# Patient Record
Sex: Male | Born: 1985
Health system: Southern US, Community
[De-identification: ages and names within clinical notes are randomized; demographics above are authoritative.]

## PROBLEM LIST (undated history)

## (undated) DIAGNOSIS — K219 Gastro-esophageal reflux disease without esophagitis: Secondary | ICD-10-CM

## (undated) HISTORY — PX: OTHER SURGICAL HISTORY: SHX169

## (undated) HISTORY — PX: AMPUTATION: SHX166

## (undated) HISTORY — PX: FRACTURE SURGERY: SHX138

## (undated) HISTORY — PX: HAND SURGERY: SHX662

## (undated) HISTORY — PX: TONSILLECTOMY: SUR1361

---

## 2014-11-01 HISTORY — PX: SHOULDER ARTHROSCOPY: SHX128

## 2018-04-13 ENCOUNTER — Other Ambulatory Visit: Payer: Self-pay | Admitting: Family Medicine

## 2018-04-13 DIAGNOSIS — M25511 Pain in right shoulder: Principal | ICD-10-CM

## 2018-04-13 DIAGNOSIS — G8929 Other chronic pain: Secondary | ICD-10-CM

## 2018-04-27 ENCOUNTER — Ambulatory Visit
Admission: RE | Admit: 2018-04-27 | Discharge: 2018-04-27 | Disposition: A | Discharge status: Home / Self Care | Payer: Commercial Managed Care - PPO | Source: Ambulatory Visit | Attending: Family Medicine | Admitting: Family Medicine

## 2018-04-27 ENCOUNTER — Other Ambulatory Visit: Payer: Self-pay | Admitting: Family Medicine

## 2018-04-27 DIAGNOSIS — S43491A Other sprain of right shoulder joint, initial encounter: Secondary | ICD-10-CM | POA: Insufficient documentation

## 2018-04-27 DIAGNOSIS — G8929 Other chronic pain: Secondary | ICD-10-CM | POA: Diagnosis present

## 2018-04-27 DIAGNOSIS — M25511 Pain in right shoulder: Secondary | ICD-10-CM | POA: Diagnosis present

## 2018-04-27 DIAGNOSIS — X58XXXA Exposure to other specified factors, initial encounter: Secondary | ICD-10-CM | POA: Diagnosis not present

## 2018-04-27 MED ORDER — IOPAMIDOL (ISOVUE-200) INJECTION 41%
1.0000 mL | Freq: Once | INTRAVENOUS | Status: AC | PRN
  Administered 2018-04-27: 1 mL via INTRAVENOUS
  Filled 2018-04-27: qty 50

## 2018-04-27 MED ORDER — GADOBENATE DIMEGLUMINE 529 MG/ML IV SOLN
0.1000 mL | Freq: Once | INTRAVENOUS | Status: AC | PRN
  Administered 2018-04-27: 0.1 mL via INTRAVENOUS

## 2018-04-27 MED ORDER — SODIUM CHLORIDE 0.9 % IJ SOLN
20.0000 mL | Freq: Once | INTRAMUSCULAR | Status: AC
  Administered 2018-04-27: 20 mL

## 2018-04-27 MED ORDER — LIDOCAINE HCL (PF) 1 % IJ SOLN
5.0000 mL | Freq: Once | INTRAMUSCULAR | Status: AC
  Administered 2018-04-27: 5 mL

## 2018-05-11 ENCOUNTER — Encounter
Admission: RE | Admit: 2018-05-11 | Discharge: 2018-05-11 | Disposition: A | Discharge status: Home / Self Care | Payer: Commercial Managed Care - PPO | Source: Ambulatory Visit | Attending: Orthopedic Surgery | Admitting: Orthopedic Surgery

## 2018-05-11 ENCOUNTER — Other Ambulatory Visit: Payer: Self-pay

## 2018-05-11 HISTORY — DX: Gastro-esophageal reflux disease without esophagitis: K21.9

## 2018-05-11 NOTE — Patient Instructions (Signed)
Your procedure is scheduled on: 05-15-18 Report to Same Day Surgery 2nd floor medical mall [facility](Medical Mall Entrance-take elevator on left to 2nd floor.  Check in with surgery information desk.) To find out your arrival time please call [phone] between 1PM - 3PM on 05-12-18  Remember: Instructions that are not followed completely may result in serious medical risk, up to and including death, or upon the discretion of your surgeon and anesthesiologist your surgery may need to be rescheduled.    _x___ 1. Do not eat food after midnight the night before your procedure. NO GUM OR CANDY AFTER MIDNIGHT. You may drink clear liquids up to 2 hours before you are scheduled to arrive at the hospital for your procedure.  Do not drink clear liquids within 2 hours of your scheduled arrival to the hospital.  Clear liquids include  --Water or Apple juice without pulp  --Clear carbohydrate beverage such as ClearFast or Gatorade  --Black Coffee or Clear Tea (No milk, no creamers, do not add anything to the coffee or Tea     __x__ 2. No Alcohol for 24 hours before or after surgery.   __x__3. No Smoking or e-cigarettes for 24 prior to surgery.  Do not use any chewable tobacco products for at least 6 hour prior to surgery   ____  4. Bring all medications with you on the day of surgery if instructed.    __x__ 5. Notify your doctor if there is any change in your medical condition     (cold, fever, infections).    x___6. On the morning of surgery brush your teeth with toothpaste and water.  You may rinse your mouth with mouth wash if you wish.  Do not swallow any toothpaste or mouthwash.   Do not wear jewelry, make-up, hairpins, clips or nail polish.  Do not wear lotions, powders, or perfumes. You may wear deodorant.  Do not shave 48 hours prior to surgery. Men may shave face and neck.  Do not bring valuables to the hospital.    [facility] any belongings or valuables.    Contacts, dentures or bridgework may not be worn into surgery.  Leave your suitcase in the car. After surgery it may be brought to your room.  For patients admitted to the hospital, discharge time is determined by your treatment team.  _  Patients discharged the day of surgery will not be allowed to drive home.  You will need someone to drive you home and stay with you the night of your procedure.    Please read over the following fact sheets that you were given:   [facility] Preparing for [facility]   ____ Take anti-hypertensive listed below, cardiac, seizure, asthma, anti-reflux and psychiatric medicines. These include:  1. NONE  2.  3.  4.  5.  6.  ____Fleets enema or Magnesium Citrate as directed.   ____ Use CHG Soap or sage wipes as directed on instruction sheet   ____ Use inhalers on the day of surgery and bring to hospital day of surgery  ____ Stop Metformin and Janumet 2 days prior to surgery.    ____ Take 1/2 of usual insulin dose the night before surgery and none on the morning surgery.   ____ Follow recommendations from Cardiologist, Pulmonologist or PCP regarding stopping Aspirin, Coumadin, Plavix ,Eliquis, Effient, or Pradaxa, and Pletal.  X____Stop Anti-inflammatories such as Advil, Aleve, Ibuprofen, Motrin, Naproxen, Naprosyn, Goodies powders or aspirin products NOW-OK to take Tylenol  ____ Stop supplements until after surgery.     ____ Bring C-Pap to the hospital.

## 2018-05-15 ENCOUNTER — Encounter
Admission: RE | Disposition: A | Discharge status: Home / Self Care | Payer: Self-pay | Source: Ambulatory Visit | Attending: Orthopedic Surgery

## 2018-05-15 ENCOUNTER — Ambulatory Visit
Admission: RE | Admit: 2018-05-15 | Discharge: 2018-05-15 | Disposition: A | Discharge status: Home / Self Care | Payer: Commercial Managed Care - PPO | Source: Ambulatory Visit | Attending: Orthopedic Surgery | Admitting: Orthopedic Surgery

## 2018-05-15 ENCOUNTER — Ambulatory Visit: Payer: Commercial Managed Care - PPO | Admitting: Certified Registered Nurse Anesthetist

## 2018-05-15 ENCOUNTER — Other Ambulatory Visit: Payer: Self-pay

## 2018-05-15 ENCOUNTER — Encounter: Payer: Self-pay | Admitting: *Deleted

## 2018-05-15 DIAGNOSIS — W000XXA Fall on same level due to ice and snow, initial encounter: Secondary | ICD-10-CM | POA: Insufficient documentation

## 2018-05-15 DIAGNOSIS — Z79899 Other long term (current) drug therapy: Secondary | ICD-10-CM | POA: Insufficient documentation

## 2018-05-15 DIAGNOSIS — Z89421 Acquired absence of other right toe(s): Secondary | ICD-10-CM | POA: Insufficient documentation

## 2018-05-15 DIAGNOSIS — S43491A Other sprain of right shoulder joint, initial encounter: Secondary | ICD-10-CM | POA: Diagnosis not present

## 2018-05-15 HISTORY — PX: BICEPT TENODESIS: SHX5116

## 2018-05-15 HISTORY — PX: SHOULDER ARTHROSCOPY WITH LABRAL REPAIR: SHX5691

## 2018-05-15 SURGERY — ARTHROSCOPY, SHOULDER, WITH GLENOID LABRUM REPAIR
Anesthesia: General | Laterality: Right

## 2018-05-15 MED ORDER — EPINEPHRINE 30 MG/30ML IJ SOLN
INTRAMUSCULAR | Status: AC
  Filled 2018-05-15: qty 1

## 2018-05-15 MED ORDER — CEFAZOLIN SODIUM-DEXTROSE 2-4 GM/100ML-% IV SOLN
2.0000 g | Freq: Once | INTRAVENOUS | Status: AC
  Administered 2018-05-15: 2 g via INTRAVENOUS

## 2018-05-15 MED ORDER — PHENYLEPHRINE HCL 10 MG/ML IJ SOLN
INTRAMUSCULAR | Status: AC
  Filled 2018-05-15: qty 1

## 2018-05-15 MED ORDER — FENTANYL CITRATE (PF) 250 MCG/5ML IJ SOLN
INTRAMUSCULAR | Status: AC
  Filled 2018-05-15: qty 5

## 2018-05-15 MED ORDER — MIDAZOLAM HCL 2 MG/2ML IJ SOLN
INTRAMUSCULAR | Status: AC
  Administered 2018-05-15: 1 mg via INTRAVENOUS
  Filled 2018-05-15: qty 2

## 2018-05-15 MED ORDER — LACTATED RINGERS IV SOLN
INTRAVENOUS | Status: DC
  Administered 2018-05-15 (×2): via INTRAVENOUS

## 2018-05-15 MED ORDER — LIDOCAINE HCL (PF) 1 % IJ SOLN
INTRAMUSCULAR | Status: AC
  Filled 2018-05-15: qty 5

## 2018-05-15 MED ORDER — PROPOFOL 10 MG/ML IV BOLUS
INTRAVENOUS | Status: AC
  Filled 2018-05-15: qty 20

## 2018-05-15 MED ORDER — BUPIVACAINE LIPOSOME 1.3 % IJ SUSP
INTRAMUSCULAR | Status: AC
  Filled 2018-05-15: qty 20

## 2018-05-15 MED ORDER — PROPOFOL 10 MG/ML IV BOLUS
INTRAVENOUS | Status: DC | PRN
  Administered 2018-05-15: 200 mg via INTRAVENOUS
  Administered 2018-05-15: 50 mg via INTRAVENOUS

## 2018-05-15 MED ORDER — FENTANYL CITRATE (PF) 100 MCG/2ML IJ SOLN
INTRAMUSCULAR | Status: DC | PRN
  Administered 2018-05-15: 250 ug via INTRAVENOUS
  Administered 2018-05-15: 100 ug via INTRAVENOUS

## 2018-05-15 MED ORDER — LIDOCAINE HCL (CARDIAC) PF 100 MG/5ML IV SOSY
PREFILLED_SYRINGE | INTRAVENOUS | Status: DC | PRN
  Administered 2018-05-15 (×2): 100 mg via INTRAVENOUS

## 2018-05-15 MED ORDER — MIDAZOLAM HCL 2 MG/2ML IJ SOLN
1.0000 mg | Freq: Once | INTRAMUSCULAR | Status: AC
  Administered 2018-05-15: 1 mg via INTRAVENOUS

## 2018-05-15 MED ORDER — ROCURONIUM BROMIDE 100 MG/10ML IV SOLN
INTRAVENOUS | Status: DC | PRN
  Administered 2018-05-15: 80 mg via INTRAVENOUS
  Administered 2018-05-15: 20 mg via INTRAVENOUS

## 2018-05-15 MED ORDER — LIDOCAINE HCL (PF) 1 % IJ SOLN
INTRAMUSCULAR | Status: AC
  Filled 2018-05-15: qty 30

## 2018-05-15 MED ORDER — MIDAZOLAM HCL 2 MG/2ML IJ SOLN
INTRAMUSCULAR | Status: AC
  Administered 2018-05-15: 2 mg via INTRAVENOUS
  Filled 2018-05-15: qty 2

## 2018-05-15 MED ORDER — BUPIVACAINE HCL (PF) 0.5 % IJ SOLN
INTRAMUSCULAR | Status: AC
  Filled 2018-05-15: qty 10

## 2018-05-15 MED ORDER — LIDOCAINE HCL (PF) 2 % IJ SOLN
INTRAMUSCULAR | Status: AC
  Filled 2018-05-15: qty 10

## 2018-05-15 MED ORDER — BUPIVACAINE LIPOSOME 1.3 % IJ SUSP
INTRAMUSCULAR | Status: DC | PRN
  Administered 2018-05-15: 20 mL via PERINEURAL

## 2018-05-15 MED ORDER — FENTANYL CITRATE (PF) 100 MCG/2ML IJ SOLN: 25.0000 ug | INTRAMUSCULAR | Status: DC | PRN

## 2018-05-15 MED ORDER — OXYCODONE HCL 5 MG PO TABS
5.0000 mg | ORAL_TABLET | Freq: Once | ORAL | Status: DC
  Filled 2018-05-15: qty 1

## 2018-05-15 MED ORDER — BUPIVACAINE-EPINEPHRINE (PF) 0.5% -1:200000 IJ SOLN
INTRAMUSCULAR | Status: AC
  Filled 2018-05-15: qty 30

## 2018-05-15 MED ORDER — FENTANYL CITRATE (PF) 100 MCG/2ML IJ SOLN
INTRAMUSCULAR | Status: AC
  Administered 2018-05-15: 25 ug via INTRAVENOUS
  Filled 2018-05-15: qty 2

## 2018-05-15 MED ORDER — BUPIVACAINE HCL (PF) 0.5 % IJ SOLN
INTRAMUSCULAR | Status: DC | PRN
  Administered 2018-05-15: 10 mL via PERINEURAL

## 2018-05-15 MED ORDER — ONDANSETRON HCL 4 MG/2ML IJ SOLN
INTRAMUSCULAR | Status: AC
  Filled 2018-05-15: qty 2

## 2018-05-15 MED ORDER — ROCURONIUM BROMIDE 50 MG/5ML IV SOLN
INTRAVENOUS | Status: AC
  Filled 2018-05-15: qty 2

## 2018-05-15 MED ORDER — SUGAMMADEX SODIUM 200 MG/2ML IV SOLN
INTRAVENOUS | Status: AC
  Filled 2018-05-15: qty 2

## 2018-05-15 MED ORDER — FENTANYL CITRATE (PF) 100 MCG/2ML IJ SOLN
25.0000 ug | Freq: Once | INTRAMUSCULAR | Status: AC
  Administered 2018-05-15: 25 ug via INTRAVENOUS

## 2018-05-15 MED ORDER — SODIUM CHLORIDE 0.9 % IV SOLN
INTRAVENOUS | Status: DC | PRN
  Administered 2018-05-15: 30 ug/min via INTRAVENOUS

## 2018-05-15 MED ORDER — BUPIVACAINE-EPINEPHRINE 0.5% -1:200000 IJ SOLN
INTRAMUSCULAR | Status: DC | PRN
  Administered 2018-05-15: 7 mL

## 2018-05-15 MED ORDER — SUGAMMADEX SODIUM 200 MG/2ML IV SOLN
INTRAVENOUS | Status: DC | PRN
  Administered 2018-05-15: 200 mg via INTRAVENOUS

## 2018-05-15 MED ORDER — FAMOTIDINE 20 MG PO TABS
20.0000 mg | ORAL_TABLET | Freq: Once | ORAL | Status: AC
  Administered 2018-05-15: 20 mg via ORAL

## 2018-05-15 MED ORDER — ASPIRIN EC 325 MG PO TBEC: 325.0000 mg | DELAYED_RELEASE_TABLET | Freq: Every day | ORAL | 0 refills | Status: AC

## 2018-05-15 MED ORDER — FAMOTIDINE 20 MG PO TABS
ORAL_TABLET | ORAL | Status: AC
  Administered 2018-05-15: 20 mg via ORAL
  Filled 2018-05-15: qty 1

## 2018-05-15 MED ORDER — PHENYLEPHRINE HCL 10 MG/ML IJ SOLN
INTRAMUSCULAR | Status: DC | PRN
  Administered 2018-05-15: 100 ug via INTRAVENOUS

## 2018-05-15 MED ORDER — CEFAZOLIN SODIUM-DEXTROSE 2-4 GM/100ML-% IV SOLN
INTRAVENOUS | Status: AC
  Filled 2018-05-15: qty 100

## 2018-05-15 MED ORDER — OXYCODONE HCL 5 MG PO TABS: 5.0000 mg | ORAL_TABLET | ORAL | 0 refills | Status: DC | PRN

## 2018-05-15 MED ORDER — LACTATED RINGERS IV SOLN
INTRAVENOUS | Status: DC | PRN
  Administered 2018-05-15: 10 mL

## 2018-05-15 MED ORDER — MIDAZOLAM HCL 2 MG/2ML IJ SOLN
2.0000 mg | Freq: Once | INTRAMUSCULAR | Status: AC
  Administered 2018-05-15: 2 mg via INTRAVENOUS

## 2018-05-15 MED ORDER — ONDANSETRON 4 MG PO TBDP: 4.0000 mg | ORAL_TABLET | Freq: Three times a day (TID) | ORAL | 0 refills | Status: DC | PRN

## 2018-05-15 MED ORDER — OXYCODONE HCL 5 MG PO TABS
ORAL_TABLET | ORAL | Status: AC
  Administered 2018-05-15: 5 mg
  Filled 2018-05-15: qty 1

## 2018-05-15 MED ORDER — LIDOCAINE HCL 1 % IJ SOLN
INTRAMUSCULAR | Status: DC | PRN
  Administered 2018-05-15: 7 mL

## 2018-05-15 MED ORDER — MIDAZOLAM HCL 2 MG/2ML IJ SOLN
INTRAMUSCULAR | Status: AC
  Filled 2018-05-15: qty 2

## 2018-05-15 MED ORDER — ONDANSETRON HCL 4 MG/2ML IJ SOLN
4.0000 mg | Freq: Once | INTRAMUSCULAR | Status: AC | PRN
  Administered 2018-05-15: 4 mg via INTRAVENOUS

## 2018-05-15 SURGICAL SUPPLY — 68 items
ADAPTER IRRIG TUBE 2 SPIKE SOL (ADAPTER) IMPLANT
ANCHOR SUT BIOCOMP LK 2.9X12.5 (Anchor) ×12 IMPLANT
BRUSH SCRUB EZ  4% CHG (MISCELLANEOUS) ×2
BRUSH SCRUB EZ 4% CHG (MISCELLANEOUS) ×1 IMPLANT
BUR RADIUS 4.0X18.5 (BURR) IMPLANT
BUR RADIUS 5.5 (BURR) IMPLANT
CANNULA 5.75X7 CRYSTAL CLEAR (CANNULA) ×3 IMPLANT
CANNULA PARTIAL THREAD 2X7 (CANNULA) ×6 IMPLANT
CANNULA TWIST IN 8.25X9CM (CANNULA) IMPLANT
CHLORAPREP W/TINT 26ML (MISCELLANEOUS) ×6 IMPLANT
CLOSURE WOUND 1/2 X4 (GAUZE/BANDAGES/DRESSINGS) ×1
COOLER POLAR GLACIER W/PUMP (MISCELLANEOUS) ×3 IMPLANT
COVER LIGHT HANDLE STERIS (MISCELLANEOUS) ×6 IMPLANT
CRADLE LAMINECT ARM (MISCELLANEOUS) ×3 IMPLANT
CUTTER BONE 4.0MM X 13CM (MISCELLANEOUS) ×3 IMPLANT
DRAPE IMP U-DRAPE 54X76 (DRAPES) ×6 IMPLANT
DRAPE INCISE IOBAN 66X45 STRL (DRAPES) ×3 IMPLANT
DRAPE SHEET LG 3/4 BI-LAMINATE (DRAPES) ×3 IMPLANT
ELECT REM PT RETURN 9FT ADLT (ELECTROSURGICAL)
ELECTRODE REM PT RTRN 9FT ADLT (ELECTROSURGICAL) IMPLANT
GAUZE PETRO XEROFOAM 1X8 (MISCELLANEOUS) ×3 IMPLANT
GAUZE SPONGE 4X4 12PLY STRL (GAUZE/BANDAGES/DRESSINGS) ×3 IMPLANT
GLOVE BIOGEL PI IND STRL 8 (GLOVE) ×1 IMPLANT
GLOVE BIOGEL PI INDICATOR 8 (GLOVE) ×2
GLOVE SURG SYN 7.5  E (GLOVE) ×2
GLOVE SURG SYN 7.5 E (GLOVE) ×1 IMPLANT
GOWN STRL REUS W/ TWL LRG LVL3 (GOWN DISPOSABLE) ×1 IMPLANT
GOWN STRL REUS W/TWL LRG LVL3 (GOWN DISPOSABLE) ×2
GOWN STRL REUS W/TWL LRG LVL4 (GOWN DISPOSABLE) ×3 IMPLANT
IV LACTATED RINGER IRRG 3000ML (IV SOLUTION) ×40
IV LR IRRIG 3000ML ARTHROMATIC (IV SOLUTION) ×20 IMPLANT
KIT INSERTION 2.9 PUSHLOCK (KITS) ×3 IMPLANT
KIT SUTURETAK 3.0 INSERT PERC (KITS) IMPLANT
KIT TURNOVER KIT A (KITS) ×3 IMPLANT
LASSO 25 DEG RIGHT QUICKPASS (SUTURE) ×3 IMPLANT
MANIFOLD NEPTUNE II (INSTRUMENTS) ×3 IMPLANT
MAT BLUE FLOOR 46X72 FLO (MISCELLANEOUS) ×3 IMPLANT
NDL SAFETY ECLIPSE 18X1.5 (NEEDLE) ×1 IMPLANT
NEEDLE HYPO 18GX1.5 SHARP (NEEDLE) ×2
NEEDLE HYPO 22GX1.5 SAFETY (NEEDLE) ×3 IMPLANT
PACK ARTHROSCOPY SHOULDER (MISCELLANEOUS) ×3 IMPLANT
PAD ABD DERMACEA PRESS 5X9 (GAUZE/BANDAGES/DRESSINGS) ×3 IMPLANT
PAD WRAPON POLAR SHDR XLG (MISCELLANEOUS) ×1 IMPLANT
PORT APPOLLO RF 90DEGREE MULTI (SURGICAL WAND) ×3 IMPLANT
SET ADAPTER IRRIG ARTHO 72IN Y (ADAPTER) ×3 IMPLANT
SET TUBE SUCT SHAVER OUTFL 24K (TUBING) IMPLANT
SET TUBE TIP INTRA-ARTICULAR (MISCELLANEOUS) IMPLANT
SLING ULTRA II M (MISCELLANEOUS) IMPLANT
STRAP SAFETY 5IN WIDE (MISCELLANEOUS) ×3 IMPLANT
STRIP CLOSURE SKIN 1/2X4 (GAUZE/BANDAGES/DRESSINGS) ×2 IMPLANT
SUT ETHILON 4-0 (SUTURE) ×2
SUT ETHILON 4-0 FS2 18XMFL BLK (SUTURE) ×1
SUT LASSO 90 DEG SD STR (SUTURE) IMPLANT
SUT MNCRL 4-0 (SUTURE) ×2
SUT MNCRL 4-0 27XMFL (SUTURE) ×1
SUTURE ETHLN 4-0 FS2 18XMF BLK (SUTURE) ×1 IMPLANT
SUTURE MNCRL 4-0 27XMF (SUTURE) ×1 IMPLANT
SYR 10ML LL (SYRINGE) ×3 IMPLANT
TAPE CLOTH 3X10 WHT NS LF (GAUZE/BANDAGES/DRESSINGS) ×3 IMPLANT
TAPE MICROFOAM 4IN (TAPE) ×3 IMPLANT
TAPE SUT LABRALTAP WHT/BLK (SUTURE) ×12 IMPLANT
TUBING ARTHRO INFLOW-ONLY STRL (TUBING) IMPLANT
TUBING CONNECTING 10 (TUBING) ×2 IMPLANT
TUBING CONNECTING 10' (TUBING) ×1
TUBING REDEUCE PAT W/CON 8IN (MISCELLANEOUS) ×3 IMPLANT
TUBING REDEUCE PUMP W/CON 8IN (MISCELLANEOUS) ×3 IMPLANT
WAND HAND CNTRL MULTIVAC 90 (MISCELLANEOUS) IMPLANT
WRAPON POLAR PAD SHDR XLG (MISCELLANEOUS) ×3

## 2018-05-15 NOTE — OR Nursing (Signed)
Discussed discharge instructions with pt and Mom. Both voice understanding.

## 2018-05-15 NOTE — Anesthesia Preprocedure Evaluation (Signed)
Anesthesia Evaluation  Patient identified by MRN, date of birth, ID band Patient awake    Reviewed: Allergy & Precautions, H&P , NPO status , Patient's Chart, lab work & pertinent test results, reviewed documented beta blocker date and time   Airway Mallampati: III  TM Distance: >3 FB Neck ROM: full    Dental  (+) Teeth Intact   Pulmonary neg pulmonary ROS, Current Smoker,    Pulmonary exam normal        Cardiovascular Exercise Tolerance: Good negative cardio ROS Normal cardiovascular exam Rhythm:regular Rate:Normal     Neuro/Psych negative neurological ROS  negative psych ROS   GI/Hepatic negative GI ROS, Neg liver ROS, GERD  ,  Endo/Other  negative endocrine ROS  Renal/GU negative Renal ROS  negative genitourinary   Musculoskeletal   Abdominal   Peds  Hematology negative hematology ROS (+)   Anesthesia Other Findings Past Medical History: No date: GERD (gastroesophageal reflux disease)     Comment:  occ Past Surgical History: No date: AMPUTATION; Right     Comment:  partial amputation toe No date: HAND SURGERY 2016: SHOULDER ARTHROSCOPY; Right No date: TONSILLECTOMY No date: umbilical cyst BMI    Body Mass Index:  36.10 kg/m     Reproductive/Obstetrics negative OB ROS                             Anesthesia Physical Anesthesia Plan  ASA: II  Anesthesia Plan: General ETT   Post-op Pain Management:  Regional for Post-op pain   Induction:   PONV Risk Score and Plan: 2  Airway Management Planned:   Additional Equipment:   Intra-op Plan:   Post-operative Plan:   Informed Consent: I have reviewed the patients History and Physical, chart, labs and discussed the procedure including the risks, benefits and alternatives for the proposed anesthesia with the patient or authorized representative who has indicated his/her understanding and acceptance.   Dental Advisory  Given  Plan Discussed with: CRNA  Anesthesia Plan Comments:         Anesthesia Quick Evaluation

## 2018-05-15 NOTE — Discharge Instructions (Signed)
AMBULATORY SURGERY  DISCHARGE INSTRUCTIONS   1) The drugs that you were given will stay in your system until tomorrow so for the next 24 hours you should not:  A) Drive an automobile B) Make any legal decisions C) Drink any alcoholic beverage   2) You may resume regular meals tomorrow.  Today it is better to start with liquids and gradually work up to solid foods.  You may eat anything you prefer, but it is better to start with liquids, then soup and crackers, and gradually work up to solid foods.   3) Please notify your doctor immediately if you have any unusual bleeding, trouble breathing, redness and pain at the surgery site, drainage, fever, or pain not relieved by medication. 4)   5) Your post-operative visit with Dr.                                     is: Date:                        Time:    Please call to schedule your post-operative visit.  6) Additional Instructions:     Post-Op Instructions  1. Bracing: You will wear a shoulder immobilizer or sling for at least 3 weeks. Total time will be determined by type of surgical repair and can be discussed at 1st postop appointment.  2. Driving: No driving for 3 weeks post-op. When driving, do not wear the immobilizer.  3. Activity: No active lifting for 2 months. Wrist, hand, and elbow motion only. Avoid lifting the upper arm away from the body except for hygiene. You are permitted to bend and straighten the elbow actively. You may use your hand and wrist for typing, writing, and managing utensils (cutting food). Do not lift more than a coffee cup for 8 weeks.  When sleeping or resting, inclined positions (recliner chair or wedge pillow) and a pillow under the forearm for support may provide better comfort for up to 4 weeks.  Avoid long distance travel for 4 weeks.  Return to normal activities after labral repair normally takes 6 months on average. If rehab goes very well, may be able to do most activities at 4 months, except  overhead or contact sports.  4. Physical Therapy: Begins 3-4 days after surgery, and proceeds 2 times per week for the first 4 weeks, then 1-2 times per week from weeks 4-8 post-op.  5. Medications:  - You will be provided a prescription for narcotic pain medicine. After surgery, take 1-2 narcotic tablets every 4 hours if needed for severe pain.  - A prescription for anti-nausea medication will be provided in case the narcotic medicine causes nausea - take 1 tablet every 6 hours only if nauseated.   - Take tylenol 1000 mg every 8 hours for pain.  May stop tylenol 3 days after surgery if you are having minimal pain.  If you are taking prescription medication for anxiety, depression, insomnia, muscle spasm, chronic pain, or for attention deficit disorder, you are advised that you are at a higher risk of adverse effects with use of narcotics post-op, including narcotic addiction/dependence, depressed breathing, death. If you use non-prescribed substances: alcohol, marijuana, cocaine, heroin, methamphetamines, etc., you are at a higher risk of adverse effects with use of narcotics post-op, including narcotic addiction/dependence, depressed breathing, death. You are advised that taking > 50 morphine milligram equivalents (MME)  of narcotic pain medication per day results in twice the risk of overdose or death. For your prescription provided: oxycodone 5 mg - taking more than 6 tablets per day would result in > 50 morphine milligram equivalents (MME) of narcotic pain medication. Be advised that we will prescribe narcotics short-term, for acute post-operative pain only - 3 weeks for major operations such as shoulder repair/reconstruction surgeries.   6. Post-Op Appointment:  Your first post-op appointment will be 10-14 days post-op.  7. Work or School: For most, but not all procedures, we advise staying out of work or school for at least 1 to 2 weeks in order to recover from the stress of surgery and to  allow time for healing.   If you need a work or school note this can be provided.   Post-operative Brace: Apply and remove the brace you received as you were instructed to at the time of fitting and as described in detail as the braces instructions for use indicate.  Wear the brace for the period of time prescribed by your physician.  The brace can be cleaned with soap and water and allowed to air dry only.  Should the brace result in increased pain, decreased feeling (numbness/tingling), increased swelling or an overall worsening of your medical condition, please contact your doctor immediately.  If an emergency situation occurs as a result of wearing the brace after normal business hours, please dial 911 and seek immediate medical attention.  Let your doctor know if you have any further questions about the brace issued to you. Refer to the shoulder sling instructions for use if you have any questions regarding the correct fit of your shoulder sling.  [facility] Customer Care for Troubleshooting: [phone]  Video that illustrates how to properly use a shoulder sling: "Instructions for Proper Use of an Orthopaedic Sling" http://bass.com/

## 2018-05-15 NOTE — Anesthesia Post-op Follow-up Note (Signed)
Anesthesia QCDR form completed.        

## 2018-05-15 NOTE — Anesthesia Procedure Notes (Signed)
Anesthesia Regional Block: Interscalene brachial plexus block   Pre-Anesthetic Checklist: ,, timeout performed, Correct Patient, Correct Site, Correct Laterality, Correct Procedure, Correct Position, site marked, Risks and benefits discussed,  Surgical consent,  Pre-op evaluation,  At surgeon's request and post-op pain management  Laterality: Right  Prep: chloraprep       Needles:   Needle Type: Echogenic Stimulator Needle     Needle Length: 10cm  Needle Gauge: 20     Additional Needles:   Procedures:,,,, ultrasound used (permanent image in chart),,,,   Nerve Stimulator or Paresthesia:  Response: right bicep,   Additional Responses:   Narrative:  Start time: 05/15/2018 7:15 AM Anesthesiologist: Yevette EdwardsAdams, James G, MD  Additional Notes: Pt tolerated the procedure well. Pos twitch and easy atraumatic injection with no pain or pressure.  30cc and no iv sx.  Vsst. Fawn KirkJA

## 2018-05-15 NOTE — Transfer of Care (Signed)
Immediate Anesthesia Transfer of Care Note  Patient: Cory RuizBlake Rocha  Procedure(s) Performed: SHOULDER ARTHROSCOPY WITH LABRAL REPAIR (Right ) BICEPS TENODESIS (Right )  Patient Location: PACU  Anesthesia Type:General  Level of Consciousness: awake, alert , oriented and patient cooperative  Airway & Oxygen Therapy: Patient Spontanous Breathing and Patient connected to nasal cannula oxygen  Post-op Assessment: Report given to RN and Post -op Vital signs reviewed and stable  Post vital signs: Reviewed and stable  Last Vitals:  Vitals Value Taken Time  BP    Temp    Pulse    Resp    SpO2      Last Pain:  Vitals:   05/15/18 0731  TempSrc:   PainSc: 0-No pain         Complications: No apparent anesthesia complications

## 2018-05-15 NOTE — Anesthesia Procedure Notes (Signed)
Procedure Name: Intubation Date/Time: 05/15/2018 7:44 AM Performed by: Bernardo Heater, CRNA Pre-anesthesia Checklist: Patient identified, Emergency Drugs available, Suction available and Patient being monitored Patient Re-evaluated:Patient Re-evaluated prior to induction Oxygen Delivery Method: Circle system utilized Preoxygenation: Pre-oxygenation with 100% oxygen Induction Type: IV induction Laryngoscope Size: Mac and 4 Grade View: Grade I Number of attempts: 1 Airway Equipment and Method: Stylet Placement Confirmation: ETT inserted through vocal cords under direct vision,  positive ETCO2 and breath sounds checked- equal and bilateral Secured at: 23 cm Tube secured with: Tape Dental Injury: Teeth and Oropharynx as per pre-operative assessment

## 2018-05-15 NOTE — H&P (Signed)
Paper H&P to be scanned into permanent record. H&P reviewed. No significant changes noted.  

## 2018-05-15 NOTE — Op Note (Signed)
DATE: 05/15/2018   PRE-OP DIAGNOSIS:  1. Right shoulder posterior labral tear   POST-OP DIAGNOSIS:  1. Right shoulder posterior labral tear   PROCEDURES:  1. Right shoulder arthroscopic posterior labral repair and capsulorraphy 2. Right shoulder extensive glenohumeral debridement   SURGEON: Rosealee AlbeeSunny H. Alianys Chacko, MD   ANESTHESIA: Regional    TOTAL IV FLUIDS: per anesthesia record   ESTIMATED BLOOD LOSS: minimal    DRAINS: None    SPECIMENS: None.    IMPLANTS:  Arthrex 2.649mm BioComposite PushLock Suture Anchors x 4   COMPLICATIONS: None    Indications:  Cory Rocha is a 32 y.o. male with a history of shoulder pain for ~8 months after a fall on ice. He has a prior history of R [place]. He did well from this until his recent injury. MRI showed posterior labral tear. The patient has failed non-operative management in the form of appropriate physical therapy, medications, activity modifications, and corticosteroid injection. After discussion of risks, benefits, and alternatives to surgery, the patient elected to proceed.   Procedure Details  The patient was seen in the Holding Room. The risks, benefits, complications, treatment options, and expected outcomes were discussed with the patient. The risks and potential complications of the problem and proposed treatment were discussed. This includes but is not limited to failure to fully relieve pain, continued or recurrence of pain, infection, neurovascular compromise, complications from anesthesia, stiff shoulder, bleeding, DVT, and reoperation. The patient concurred with the proposed plan, giving informed consent. The site of surgery was properly noted/marked.   The patient was placed on the OR bed in the beach chair position using a beanbag. All bony prominences were well padded. The patient was given appropriate preoperative IV antibiotics. The upper extremity was prescrubbed with alcohol,  prepped with Chloroprep, and draped in the usual sterile fashion. A Time Out Procedure was performed confirming correct patient, procedure, and laterality.   Exam under anesthesia showed: laxity anterior 1+, posterior 2+. 170FF, 50 ER with arm at side. Positive click was noted posteriorly with forward flexion of the arm.    Glenohumeral portals were marked and injected with dilute epinephrine in lidocaine. An 11 blade was used to make stab incisions in the posterior soft spot for the standard posterior viewing portal. I made an additional high anterior portal, which was used as our anterior viewing portal. We performed a thorough arthroscopic evaluation of the glenohumeral joint through both the anterior and posterior viewing portals.    Glenohumeral Joint: Articular cartilage of the humeral head and glenoid: mild fraying of the glenoid and humeral head Synovium: injected and erythematous Anterior Labrum: normal  Posterior Labrum: posterior labral tear extending from 6 o'clock to 9 o'clock position Superior Labrum: Normal Inferior Labrum: normal Anterior Capsule: normal Inferior Capsule: normal  Posterior Capsule: patulous  Rotator interval: synovitic  Superior glenohumeral ligament: normal  Middle glenohumeral ligament: normal  Inferior glenohumeral ligament: normal. No HAGL. Biceps: normal Rotator cuff findings: normal subscapularis, infraspinatus; mild fraying of anterior supraspinatus consistent with small partial-thickness articular-sided rotator cuff tear measuring ~692mm.   An accessory inferior anterior portal was made as a working portal. The elbow was placed in a position of slight anterior and inferior traction to allow for improved visualization. Next, a portal of [place] was made along the posterior 1/3 of the acromion just inferior to the lateral edge using a spinal needle to confirm appropriate positioning.  A 7mm cannula was placed in the portal  of [place]. A shaver and  arthrocare wand were used to debride and coagulate inflamed synovium about the rotator interval. An elevator was used to elevate the labrum off the glenoid posteriorly in the affected regions described above. Prior sutures were cut and removed with a grasper. Areas of damaged labrum were gently debrided with a shaver. A rasp and shaver were used to roughen the glenoid for improvement of healing.  We began by passing tape through the posteroinferior capsule and labrum at the 6:00 position using an Arthrex SutureLasso 25 degree to the right. A 2.9 mm PushLock was loaded onto to the suture. The anchor was advanced to the glenoid, the sutures tightened, and the anchor impacted with good purchase. This nicely tightened the labrum onto the glenoid as a bumper and tightened the capsule. The suture tails were cut flush with the articular cartilage. This process was repeated for anchors at the 7:00, 8:00, and 9:00 positions. The repair was stable to probing, there was visible evidence of decreased joint space and capsular area. The shaver was used to debride any loose bony fragments from drilling as well as the previously visualized frayed cartilage edges on the humerus and glenoid. Additionally, the partial thickness supraspinatus tear was debrided. Arthrocare was used to achieve hemostasis and coagulate any remainder of inflamed synovium. Arthroscopic fluid was evacuated from the joint. The wounds were closed with 3-0 nylon. Xeroform gauze and sterile dressings were applied. Patient was placed in a neutral shoulder immobilizer and Polar Care.      POSTOPERATIVE PLAN:  Patient will be discharged to home.  Physical therapy 3-4 days after surgery.  Return to the clinic 10-14 days postop for suture removal Maintain arm in immobilizer with neutral wedge. NWB. Follow Posterior Stabilization Rehab Guidelines.

## 2018-05-16 NOTE — Anesthesia Postprocedure Evaluation (Signed)
Anesthesia Post Note  Patient: Cory Rocha  Procedure(s) Performed: SHOULDER ARTHROSCOPY WITH LABRAL REPAIR (Right ) BICEPS TENODESIS (Right )  Patient location during evaluation: PACU Anesthesia Type: General Level of consciousness: awake and alert Pain management: pain level controlled Vital Signs Assessment: post-procedure vital signs reviewed and stable Respiratory status: spontaneous breathing, nonlabored ventilation, respiratory function stable and patient connected to nasal cannula oxygen Cardiovascular status: blood pressure returned to baseline and stable Postop Assessment: no apparent nausea or vomiting Anesthetic complications: no     Last Vitals:  Vitals:   05/15/18 1200 05/15/18 1211  BP: 109/85 126/80  Pulse: 94   Resp: 14 16  Temp: 36.5 C   SpO2: 99% 99%    Last Pain:  Vitals:   05/16/18 0815  TempSrc:   PainSc: 4                  Yevette EdwardsJames G Antavius Sperbeck

## 2018-08-03 ENCOUNTER — Encounter: Payer: Self-pay | Admitting: Dietician

## 2018-08-03 ENCOUNTER — Encounter: Payer: Commercial Managed Care - PPO | Attending: Family Medicine | Admitting: Dietician

## 2018-08-03 VITALS — Ht 70.0 in | Wt 271.0 lb

## 2018-08-03 DIAGNOSIS — Z713 Dietary counseling and surveillance: Secondary | ICD-10-CM | POA: Insufficient documentation

## 2018-08-03 DIAGNOSIS — E6609 Other obesity due to excess calories: Secondary | ICD-10-CM

## 2018-08-03 DIAGNOSIS — E669 Obesity, unspecified: Secondary | ICD-10-CM | POA: Diagnosis present

## 2018-08-03 DIAGNOSIS — Z6838 Body mass index (BMI) 38.0-38.9, adult: Secondary | ICD-10-CM

## 2018-08-03 NOTE — Progress Notes (Signed)
Medical Nutrition Therapy: Visit start time: 0900  end time: 1030  Assessment:  Diagnosis: obesity Past medical history: elevated triglycerides  Psychosocial issues/ stress concerns: none  Preferred learning method:  Jill Alexanders . Hands-on   Current weight: 271.0lbs with steel-toed shoes Height: 5'10" Medications, supplements: ibuprofen as needed  Progress and evaluation: Patient reports significant physical activity as a teenager in high school sports, and stayed active until moving to [place] and beginning new job. He has worked on [employer] control in the past by increasing physical activity. He reports gaining weight over the past 3 years since activity has gradually declined; he also had shoulder surgery several months ago and is not yet clear to resume regular exercise but is undergoing physical therapy. He is now working some long hours and will often eat fast food and take-out meals as he does not have much time for cooking.   Physical activity: none at this time; plans to resume once adopting healthier diet.   Dietary Intake:  Usual eating pattern includes 3 meals and 0 snacks per day. Dining out frequency: 15 meals per week.  Breakfast: starbucks coffee and quick breakfast or convenience store  Snack: none Lunch: mikonos rest., [employer] fast food or other fast food Snack: none Supper: sometimes cooks inst potatoes with chicken or baked steak or takeout food Snack: none Beverages: water, coffee with creamer, energy drinks occasionally  Nutrition Care Education: Topics covered: weight control Basic nutrition: basic food groups, appropriate nutrient balance, appropriate meal and snack schedule, general nutrition guidelines    Weight control: benefits of weight control, determining reasonable weight loss rate, estimated kcal needs for weight loss at about 2000 and provided guidance for healthy nutrient balance; discussed food portions, and portion control strategies; quick meal options and  possible apps for meal planning; answered patient questions about weight loss supplements Advanced nutrition:  cooking techniques-- roasting, stir-frying, pre-prepping foods, dining out  Nutritional Diagnosis:  [place]-3.3 Overweight/obesity As related to decreased activity and excess calories.  As evidenced by patient with BMI of 38.4, and patient report of diet and activity history.  Intervention: Instruction as noted above.   Set goals with direction from patient.    Patient is eager to begin making lifestyle changes.      Education Materials given:  . Plate Planner with food lists . Dining out tips  . Sample meal pattern/ recipe ideas for balanced meals . Lunch on the Go . Goals/ instructions   Learner/ who was taught:  . Patient   Level of understanding: Marland Kitchen Verbalizes/ demonstrates competency   Demonstrated degree of understanding via:   Teach back Learning barriers: . None  Willingness to learn/ readiness for change: . Acceptance, ready for change  Monitoring and Evaluation:  Dietary intake, exercise, and body weight      follow up: 09/01/18

## 2018-08-03 NOTE — Patient Instructions (Signed)
   Reduce food portions by using smaller plates, drink water before starting a meal, eat slowly, etc.   Begin some regular exercise when cleared to do so.  Plan to have a vegetable or fruit with each meal. Choose low-sugar canned fruits. Consider frozen varieties of veggies and fruit, or pre-prepped fresh.   Make lower-calorie choices when at a restaurant by taking part of the meal home for leftovers, ordering smaller portions, adding vegetables or fruits and limiting high fat meats and starches.

## 2018-09-01 ENCOUNTER — Ambulatory Visit: Payer: Commercial Managed Care - PPO | Admitting: Dietician

## 2018-10-11 ENCOUNTER — Encounter: Payer: Self-pay | Admitting: Dietician

## 2018-10-11 NOTE — Progress Notes (Signed)
Have not heard back from patient to reschedule his missed appointment from 09/01/18. Sent letter to referring provider.

## 2018-11-15 DIAGNOSIS — S43431D Superior glenoid labrum lesion of right shoulder, subsequent encounter: Secondary | ICD-10-CM | POA: Diagnosis not present

## 2018-11-22 DIAGNOSIS — J069 Acute upper respiratory infection, unspecified: Secondary | ICD-10-CM | POA: Diagnosis not present

## 2019-02-05 DIAGNOSIS — J0110 Acute frontal sinusitis, unspecified: Secondary | ICD-10-CM | POA: Diagnosis not present

## 2019-03-13 DIAGNOSIS — Z0000 Encounter for general adult medical examination without abnormal findings: Secondary | ICD-10-CM | POA: Diagnosis not present

## 2019-03-19 DIAGNOSIS — E781 Pure hyperglyceridemia: Secondary | ICD-10-CM | POA: Diagnosis not present

## 2019-03-19 DIAGNOSIS — M542 Cervicalgia: Secondary | ICD-10-CM | POA: Diagnosis not present

## 2019-03-19 DIAGNOSIS — R030 Elevated blood-pressure reading, without diagnosis of hypertension: Secondary | ICD-10-CM | POA: Diagnosis not present

## 2019-03-19 DIAGNOSIS — Z23 Encounter for immunization: Secondary | ICD-10-CM | POA: Diagnosis not present

## 2019-03-19 DIAGNOSIS — Z0000 Encounter for general adult medical examination without abnormal findings: Secondary | ICD-10-CM | POA: Diagnosis not present

## 2019-04-09 ENCOUNTER — Other Ambulatory Visit: Payer: Self-pay | Admitting: Family Medicine

## 2019-04-09 DIAGNOSIS — M542 Cervicalgia: Secondary | ICD-10-CM

## 2019-04-25 ENCOUNTER — Other Ambulatory Visit: Payer: Self-pay

## 2019-04-25 ENCOUNTER — Ambulatory Visit: Payer: Commercial Managed Care - PPO

## 2019-05-28 ENCOUNTER — Other Ambulatory Visit: Payer: Self-pay

## 2019-05-28 ENCOUNTER — Ambulatory Visit
Admission: RE | Admit: 2019-05-28 | Discharge: 2019-05-28 | Disposition: A | Discharge status: Home / Self Care | Payer: Commercial Managed Care - PPO | Source: Ambulatory Visit | Attending: Family Medicine | Admitting: Family Medicine

## 2019-05-28 DIAGNOSIS — M542 Cervicalgia: Secondary | ICD-10-CM | POA: Insufficient documentation

## 2019-06-06 ENCOUNTER — Emergency Department
Admission: EM | Admit: 2019-06-06 | Discharge: 2019-06-06 | Disposition: A | Discharge status: Home / Self Care | Payer: Commercial Managed Care - PPO | Attending: Student | Admitting: Student

## 2019-06-06 ENCOUNTER — Emergency Department: Payer: Commercial Managed Care - PPO

## 2019-06-06 ENCOUNTER — Other Ambulatory Visit: Payer: Self-pay

## 2019-06-06 ENCOUNTER — Encounter: Payer: Self-pay | Admitting: Emergency Medicine

## 2019-06-06 DIAGNOSIS — R0602 Shortness of breath: Secondary | ICD-10-CM | POA: Insufficient documentation

## 2019-06-06 DIAGNOSIS — F17210 Nicotine dependence, cigarettes, uncomplicated: Secondary | ICD-10-CM | POA: Insufficient documentation

## 2019-06-06 DIAGNOSIS — Z79899 Other long term (current) drug therapy: Secondary | ICD-10-CM | POA: Insufficient documentation

## 2019-06-06 DIAGNOSIS — Z20828 Contact with and (suspected) exposure to other viral communicable diseases: Secondary | ICD-10-CM | POA: Diagnosis not present

## 2019-06-06 LAB — APTT: aPTT: 28 seconds (ref 24–36)

## 2019-06-06 LAB — CBC WITH DIFFERENTIAL/PLATELET
Abs Immature Granulocytes: 0.04 10*3/uL (ref 0.00–0.07)
Basophils Absolute: 0.1 10*3/uL (ref 0.0–0.1)
Basophils Relative: 1 %
Eosinophils Absolute: 0.2 10*3/uL (ref 0.0–0.5)
Eosinophils Relative: 2 %
HCT: 46.1 % (ref 39.0–52.0)
Hemoglobin: 15.9 g/dL (ref 13.0–17.0)
Immature Granulocytes: 1 %
Lymphocytes Relative: 28 %
Lymphs Abs: 2.1 10*3/uL (ref 0.7–4.0)
MCH: 32.7 pg (ref 26.0–34.0)
MCHC: 34.5 g/dL (ref 30.0–36.0)
MCV: 94.9 fL (ref 80.0–100.0)
Monocytes Absolute: 0.9 10*3/uL (ref 0.1–1.0)
Monocytes Relative: 12 %
Neutro Abs: 4.2 10*3/uL (ref 1.7–7.7)
Neutrophils Relative %: 56 %
Platelets: 282 10*3/uL (ref 150–400)
RBC: 4.86 MIL/uL (ref 4.22–5.81)
RDW: 11.7 % (ref 11.5–15.5)
WBC: 7.5 10*3/uL (ref 4.0–10.5)
nRBC: 0 % (ref 0.0–0.2)

## 2019-06-06 LAB — BASIC METABOLIC PANEL
Anion gap: 12 (ref 5–15)
BUN: 14 mg/dL (ref 6–20)
CO2: 24 mmol/L (ref 22–32)
Calcium: 9.4 mg/dL (ref 8.9–10.3)
Chloride: 103 mmol/L (ref 98–111)
Creatinine, Ser: 1.05 mg/dL (ref 0.61–1.24)
GFR calc Af Amer: >60 mL/min (ref >60)
GFR calc non Af Amer: >60 mL/min (ref >60)
Glucose, Bld: 90 mg/dL (ref 70–99)
Potassium: 3.9 mmol/L (ref 3.5–5.1)
Sodium: 139 mmol/L (ref 135–145)

## 2019-06-06 LAB — PROTIME-INR
INR: 1 (ref 0.8–1.2)
Prothrombin Time: 12.6 seconds (ref 11.4–15.2)

## 2019-06-06 LAB — SARS CORONAVIRUS 2 BY RT PCR (HOSPITAL ORDER, PERFORMED IN ~~LOC~~ HOSPITAL LAB): SARS Coronavirus 2: NEGATIVE

## 2019-06-06 MED ORDER — IOHEXOL 350 MG/ML SOLN
75.0000 mL | Freq: Once | INTRAVENOUS | Status: AC | PRN
  Administered 2019-06-06: 13:00:00 75 mL via INTRAVENOUS

## 2019-06-06 NOTE — ED Notes (Signed)
Patient transported to CT 

## 2019-06-06 NOTE — ED Provider Notes (Signed)
[facility] Emergency Department Provider Note  ____________________________________________   First MD Initiated Contact with Patient 06/06/19 1146     (approximate)  I have reviewed the triage vital signs and the nursing notes.  HISTORY  Chief Complaint Chest Pain    HPI Cory Rocha is a [age] y.o. male with no significant medical history who presents to the emergency department for shortness of breath, feeling lightheaded.  Patient states last night he developed an episode of chest pain that resolved.  He primarily localizes this "chest pain" to the upper left shoulder area, and states it is consistent with prior/known issues with his shoulder (s/p labral repair), for which he is receiving steroid injections for symptom control.   At work today he had another episode of shoulder pain, but this time it was associated with shortness of breath.  He denies any pain currently, but feels as though he cannot catch his breath which makes him feel lightheaded. This prompted him to seek care.  No fever or cough.  No infectious symptoms.  No recent travel, no history of blood clots, no recent surgeries.  No prolonged immobilization.         Past Medical Hx Past Medical History:  Diagnosis Date  . GERD (gastroesophageal reflux disease)    occ    Problem List There are no active problems to display for this patient.   Past Surgical Hx Past Surgical History:  Procedure Laterality Date  . AMPUTATION Right    partial amputation toe  . BICEPT TENODESIS Right 05/15/2018   Procedure: BICEPS TENODESIS;  Surgeon: Signa KellPatel, Sunny, MD;  Location: [facility] ORS;  Service: Orthopedics;  Laterality: Right;  . HAND SURGERY    . SHOULDER ARTHROSCOPY Right 2016  . SHOULDER ARTHROSCOPY WITH LABRAL REPAIR Right 05/15/2018   Procedure: SHOULDER ARTHROSCOPY WITH LABRAL REPAIR;  Surgeon: Signa KellPatel, Sunny, MD;  Location: [facility] ORS;  Service: Orthopedics;  Laterality: Right;  .  TONSILLECTOMY    . umbilical cyst      Medications Prior to Admission medications   Medication Sig Start Date End Date Taking? Authorizing Provider  aspirin-acetaminophen-caffeine (EXCEDRIN MIGRAINE) [phone] MG tablet Take 1 tablet by mouth every 6 (six) hours as needed for headache.   Yes [provider]  ibuprofen (ADVIL,MOTRIN) 200 MG tablet Take 200 mg by mouth every 6 (six) hours as needed.   Yes [provider]    Allergies Atomoxetine and Tramadol  Family Hx History reviewed. No pertinent family history.  Social Hx Social History   Tobacco Use  . Smoking status: Current Every Day Smoker    Types: E-cigarettes  . Smokeless tobacco: Former NeurosurgeonUser    Quit date: 11/11/2016  Substance Use Topics  . Alcohol use: Not Currently  . Drug use: Never    Review of Systems  Constitutional: Negative for fever. Negative for chills. Eyes: Negative for visual changes. ENT: Negative for sore throat. Cardiovascular: Negative for chest pain. Respiratory: Positive for shortness of breath. Gastrointestinal: Negative for abdominal pain. Negative for nausea. Negative for vomiting. Genitourinary: Negative for dysuria. Musculoskeletal: Negative for leg swelling. Skin: Negative for rash. Neurological: Negative for for headaches.  ____________________________________________   PHYSICAL EXAM:  Vital Signs: ED Triage Vitals  Enc Vitals Group     BP 06/06/19 1138 (!) 153/96     Pulse Rate 06/06/19 1138 (!) 111     Resp 06/06/19 1138 20     Temp 06/06/19 1138 98.4 F (36.9 C)     Temp Source  06/06/19 1138 Oral     SpO2 06/06/19 1138 99 %     Weight 06/06/19 1136 265 lb (120.2 kg)     Height 06/06/19 1136 5\' 10"  (1.778 m)     Head Circumference --      Peak Flow --      Pain Score 06/06/19 1135 0     Pain Loc --      Pain Edu? --      Excl. in [place]? --     Constitutional: Alert and oriented.  Eyes: Conjunctivae are normal. Sclera anicteric. Head:  Normocephalic. Atraumatic. Nose: No congestion. No rhinorrhea. Mouth/Throat: Mucous membranes are moist.  Neck: No stridor.   Cardiovascular: Tachycardic, regular rhythm. Grossly normal heart sounds. Extremities well perfused. Respiratory: Normal respiratory effort.  Lungs CTAB. Gastrointestinal: Soft and non-tender. No distention.  Musculoskeletal: No lower extremity edema. Neurologic:  Normal speech and language. No gross focal neurologic deficits are appreciated.  Skin: Skin is warm, dry and intact. No rash noted. Psychiatric: Mood and affect are appropriate for situation.  ____________________________________________  EKG  Sinus tachycardia.  Normal axis.  Normal intervals.  No acute ST or T wave changes.  ____________________________________________  RADIOLOGY  CT PE negative for pulmonary embolus.    ____________________________________________   PROCEDURES  Procedure(s) performed (including Critical Care):  Procedures   ____________________________________________   INITIAL IMPRESSION / ASSESSMENT AND PLAN / ED COURSE  33 y.o. male who presents to the ED for shortness of breath and lightheadedness.  On exam he is tachycardic to 110-120s.  Normal oxygen saturation and work of breathing.  No lower extremity swelling or edema.  Given his shortness of breath and tachycardia, concern for PE.  Will obtain CT PE to rule out, basic lab work.  EKG without acute ischemic changes.  Lab work unremarkable and CT PE negative for PE.  Patient feels improved, and HR improved on recheck..  We will plan for discharge with PCP follow-up, patient voices understanding is comfortable to plan at discharge.  ____________________________________________   FINAL CLINICAL IMPRESSION(S) / ED DIAGNOSES  Final diagnoses:  Shortness of breath     Note:  This document was prepared using Dragon voice recognition software and may include unintentional dictation errors.   Lilia Pro., MD 06/06/19 [phone]

## 2019-06-06 NOTE — Discharge Instructions (Addendum)
Thank you for letting us take care of you in the emergency department today.  Please follow-up with your primary care doctor to review your ER visit and follow-up on your symptoms.  We swabbed you for corona virus, you should hear your results if they are positive in 2-4 days.  In the meantime, you must quarantine herself as if you do have the virus, as we have discussed.  Please return to the emergency department for any new or worsening symptoms.

## 2019-06-06 NOTE — ED Triage Notes (Signed)
Here for chest pain that started last night and resolved but then returned again today at work. Last night was also in left shoulder.  Pain now gone but pt feels like cannot catch his breath.  No hx blood clots. No hx cardiac problems.  No fever or cough.

## 2019-10-08 ENCOUNTER — Other Ambulatory Visit: Payer: Self-pay

## 2019-10-08 ENCOUNTER — Emergency Department
Admission: EM | Admit: 2019-10-08 | Discharge: 2019-10-08 | Disposition: A | Discharge status: Home / Self Care | Payer: Commercial Managed Care - PPO | Attending: Student in an Organized Health Care Education/Training Program | Admitting: Student in an Organized Health Care Education/Training Program

## 2019-10-08 ENCOUNTER — Encounter: Payer: Self-pay | Admitting: Emergency Medicine

## 2019-10-08 ENCOUNTER — Emergency Department: Payer: Commercial Managed Care - PPO

## 2019-10-08 DIAGNOSIS — R1012 Left upper quadrant pain: Secondary | ICD-10-CM

## 2019-10-08 DIAGNOSIS — R1032 Left lower quadrant pain: Secondary | ICD-10-CM | POA: Diagnosis present

## 2019-10-08 DIAGNOSIS — R05 Cough: Secondary | ICD-10-CM | POA: Insufficient documentation

## 2019-10-08 DIAGNOSIS — F17290 Nicotine dependence, other tobacco product, uncomplicated: Secondary | ICD-10-CM | POA: Diagnosis not present

## 2019-10-08 DIAGNOSIS — R067 Sneezing: Secondary | ICD-10-CM | POA: Insufficient documentation

## 2019-10-08 LAB — URINALYSIS, COMPLETE (UACMP) WITH MICROSCOPIC
Bacteria, UA: NONE SEEN
Bilirubin Urine: NEGATIVE
Glucose, UA: NEGATIVE mg/dL
Ketones, ur: NEGATIVE mg/dL
Leukocytes,Ua: NEGATIVE
Nitrite: NEGATIVE
Protein, ur: NEGATIVE mg/dL
Specific Gravity, Urine: 1.018 (ref 1.005–1.030)
Squamous Epithelial / HPF: NONE SEEN (ref 0–5)
WBC, UA: NONE SEEN WBC/hpf (ref 0–5)
pH: 6 (ref 5.0–8.0)

## 2019-10-08 LAB — COMPREHENSIVE METABOLIC PANEL
ALT: 35 U/L (ref 0–44)
AST: 27 U/L (ref 15–41)
Albumin: 4.3 g/dL (ref 3.5–5.0)
Alkaline Phosphatase: 105 U/L (ref 38–126)
Anion gap: 8 (ref 5–15)
BUN: 18 mg/dL (ref 6–20)
CO2: 26 mmol/L (ref 22–32)
Calcium: 9.2 mg/dL (ref 8.9–10.3)
Chloride: 104 mmol/L (ref 98–111)
Creatinine, Ser: 1.04 mg/dL (ref 0.61–1.24)
GFR calc Af Amer: >60 mL/min (ref >60)
GFR calc non Af Amer: >60 mL/min (ref >60)
Glucose, Bld: 110 mg/dL — ABNORMAL HIGH (ref 70–99)
Potassium: 3.9 mmol/L (ref 3.5–5.1)
Sodium: 138 mmol/L (ref 135–145)
Total Bilirubin: 0.6 mg/dL (ref 0.3–1.2)
Total Protein: 7.5 g/dL (ref 6.5–8.1)

## 2019-10-08 LAB — CBC
HCT: 44.7 % (ref 39.0–52.0)
Hemoglobin: 15.7 g/dL (ref 13.0–17.0)
MCH: 33.1 pg (ref 26.0–34.0)
MCHC: 35.1 g/dL (ref 30.0–36.0)
MCV: 94.1 fL (ref 80.0–100.0)
Platelets: 292 10*3/uL (ref 150–400)
RBC: 4.75 MIL/uL (ref 4.22–5.81)
RDW: 11.6 % (ref 11.5–15.5)
WBC: 9.3 10*3/uL (ref 4.0–10.5)
nRBC: 0 % (ref 0.0–0.2)

## 2019-10-08 LAB — LIPASE, BLOOD: Lipase: 29 U/L (ref 11–51)

## 2019-10-08 MED ORDER — CYCLOBENZAPRINE HCL 10 MG PO TABS: 10.0000 mg | ORAL_TABLET | Freq: Three times a day (TID) | ORAL | 0 refills | Status: DC | PRN

## 2019-10-08 MED ORDER — SODIUM CHLORIDE 0.9% FLUSH: 3.0000 mL | Freq: Once | INTRAVENOUS | Status: DC

## 2019-10-08 MED ORDER — HYDROCODONE-ACETAMINOPHEN 5-325 MG PO TABS
1.0000 | ORAL_TABLET | Freq: Once | ORAL | Status: AC
  Administered 2019-10-08: 1 via ORAL
  Filled 2019-10-08: qty 1

## 2019-10-08 MED ORDER — KETOROLAC TROMETHAMINE 30 MG/ML IJ SOLN
15.0000 mg | Freq: Once | INTRAMUSCULAR | Status: AC
  Administered 2019-10-08: 15 mg via INTRAMUSCULAR
  Filled 2019-10-08: qty 1

## 2019-10-08 NOTE — Discharge Instructions (Signed)

## 2019-10-08 NOTE — ED Provider Notes (Addendum)
[facility] Emergency Department Provider Note    First MD Initiated Contact with Patient 10/08/19 2001     (approximate)  I have reviewed the triage vital signs and the nursing notes.   HISTORY  Chief Complaint Abdominal Pain    HPI Cory Rocha is a 33 y.o. male presents the ER for sudden onset left lower rib pain that occurred while he was resting in his recliner then had a coughing and then sneezing fit had sudden onset pain.  Pain is worsened with movement.  Denies any shortness of breath no fevers.  No productive cough.  Denies any history of heart troubles.  No vomiting.  No diarrhea.  No recent trauma.    Past Medical History:  Diagnosis Date   GERD (gastroesophageal reflux disease)    occ   No family history on file. Past Surgical History:  Procedure Laterality Date   AMPUTATION Right    partial amputation toe   BICEPT TENODESIS Right 05/15/2018   Procedure: BICEPS TENODESIS;  Surgeon: Signa Kell, MD;  Location: [facility] ORS;  Service: Orthopedics;  Laterality: Right;   HAND SURGERY     SHOULDER ARTHROSCOPY Right 2016   SHOULDER ARTHROSCOPY WITH LABRAL REPAIR Right 05/15/2018   Procedure: SHOULDER ARTHROSCOPY WITH LABRAL REPAIR;  Surgeon: Signa Kell, MD;  Location: [facility] ORS;  Service: Orthopedics;  Laterality: Right;   TONSILLECTOMY     umbilical cyst     There are no active problems to display for this patient.     Prior to Admission medications   Medication Sig Start Date End Date Taking? Authorizing Provider  aspirin-acetaminophen-caffeine (EXCEDRIN MIGRAINE) [phone] MG tablet Take 1 tablet by mouth every 6 (six) hours as needed for headache.    [provider]  cyclobenzaprine (FLEXERIL) 10 MG tablet Take 1 tablet (10 mg total) by mouth 3 (three) times daily as needed for muscle spasms. 10/08/19   Willy Eddy, MD  ibuprofen (ADVIL,MOTRIN) 200 MG tablet Take 200 mg by mouth every 6 (six) hours as needed.     [provider]    Allergies Atomoxetine and Tramadol    Social History Social History   Tobacco Use   Smoking status: Current Every Day Smoker    Types: E-cigarettes   Smokeless tobacco: Former Neurosurgeon    Quit date: 11/11/2016  Substance Use Topics   Alcohol use: Not Currently   Drug use: Never    Review of Systems Patient denies headaches, rhinorrhea, blurry vision, numbness, shortness of breath, chest pain, edema, cough, abdominal pain, nausea, vomiting, diarrhea, dysuria, fevers, rashes or hallucinations unless otherwise stated above in HPI. ____________________________________________   PHYSICAL EXAM:  VITAL SIGNS: Vitals:   10/08/19 1832 10/08/19 2054  BP: (!) 159/104 126/81  Pulse: (!) 106 83  Resp: 16 18  Temp: 97.8 F (36.6 C) 98.5 F (36.9 C)  SpO2: 100% 98%    Constitutional: Alert and oriented.  Eyes: Conjunctivae are normal.  Head: Atraumatic. Nose: No congestion/rhinnorhea. Mouth/Throat: Mucous membranes are moist.   Neck: No stridor. Painless ROM.  Cardiovascular: Normal rate, regular rhythm. Grossly normal heart sounds.  Good peripheral circulation. Respiratory: Normal respiratory effort.  No retractions. Lungs CTAB. Gastrointestinal: Soft, with reproducible left lateral upper quadrant abdominal pain.  Pain worsened with stretching left oblique muscle by bending to the right.  Does not have any rebound or guarding.  No crepitus.  No chest wall instability.  No contusion or ecchymosis. No distention. Marland Kitchen No CVA tenderness. Genitourinary:  Musculoskeletal: No  lower extremity tenderness nor edema.  No joint effusions. Neurologic:  Normal speech and language. No gross focal neurologic deficits are appreciated. No facial droop Skin:  Skin is warm, dry and intact. No rash noted. Psychiatric: Mood and affect are normal. Speech and behavior are normal.  ____________________________________________   LABS (all labs ordered are listed, but only  abnormal results are displayed)  Results for orders placed or performed during the hospital encounter of 10/08/19 (from the past 24 hour(s))  Lipase, blood     Status: None   Collection Time: 10/08/19  6:40 PM  Result Value Ref Range   Lipase 29 11 - 51 U/L  Comprehensive metabolic panel     Status: Abnormal   Collection Time: 10/08/19  6:40 PM  Result Value Ref Range   Sodium 138 135 - 145 mmol/L   Potassium 3.9 3.5 - 5.1 mmol/L   Chloride 104 98 - 111 mmol/L   CO2 26 22 - 32 mmol/L   Glucose, Bld 110 (H) 70 - 99 mg/dL   BUN 18 6 - 20 mg/dL   Creatinine, Ser 1.611.04 0.61 - 1.24 mg/dL   Calcium 9.2 8.9 - 09.610.3 mg/dL   Total Protein 7.5 6.5 - 8.1 g/dL   Albumin 4.3 3.5 - 5.0 g/dL   AST 27 15 - 41 U/L   ALT 35 0 - 44 U/L   Alkaline Phosphatase 105 38 - 126 U/L   Total Bilirubin 0.6 0.3 - 1.2 mg/dL   GFR calc non Af Amer >60 >60 mL/min   GFR calc Af Amer >60 >60 mL/min   Anion gap 8 5 - 15  CBC     Status: None   Collection Time: 10/08/19  6:40 PM  Result Value Ref Range   WBC 9.3 4.0 - 10.5 K/uL   RBC 4.75 4.22 - 5.81 MIL/uL   Hemoglobin 15.7 13.0 - 17.0 g/dL   HCT 04.544.7 40.939.0 - 81.152.0 %   MCV 94.1 80.0 - 100.0 fL   MCH 33.1 26.0 - 34.0 pg   MCHC 35.1 30.0 - 36.0 g/dL   RDW 91.411.6 78.211.5 - 95.615.5 %   Platelets 292 150 - 400 K/uL   nRBC 0.0 0.0 - 0.2 %  Urinalysis, Complete w Microscopic     Status: Abnormal   Collection Time: 10/08/19  6:40 PM  Result Value Ref Range   Color, Urine STRAW (A) YELLOW   APPearance CLEAR (A) CLEAR   Specific Gravity, Urine 1.018 1.005 - 1.030   pH 6.0 5.0 - 8.0   Glucose, UA NEGATIVE NEGATIVE mg/dL   Hgb urine dipstick SMALL (A) NEGATIVE   Bilirubin Urine NEGATIVE NEGATIVE   Ketones, ur NEGATIVE NEGATIVE mg/dL   Protein, ur NEGATIVE NEGATIVE mg/dL   Nitrite NEGATIVE NEGATIVE   Leukocytes,Ua NEGATIVE NEGATIVE   RBC / HPF 0-5 0 - 5 RBC/hpf   WBC, UA NONE SEEN 0 - 5 WBC/hpf   Bacteria, UA NONE SEEN NONE SEEN   Squamous Epithelial / LPF NONE SEEN 0 -  5   Mucus PRESENT    ____________________________________________ ____________________________________________ ED ECG REPORT I, Willy EddyPatrick Francely Craw, the attending physician, personally viewed and interpreted this ECG.   Date: 10/08/2019  EKG Time: 20:59  Rate: 80  Rhythm: sinus  Axis: normal  Intervals:no brugada or WPW  ST&T Change: no stemi or depression    RADIOLOGY  Mix one tablespoon with 8oz of your favorite juice or water every day until you are having soft formed stools. Then start taking  once daily if you didn't have a stool the day before.  ____________________________________________   PROCEDURES  Procedure(s) performed:  Procedures    Critical Care performed: no ____________________________________________   INITIAL IMPRESSION / ASSESSMENT AND PLAN / ED COURSE  Pertinent labs & imaging results that were available during my care of the patient were reviewed by me and considered in my medical decision making (see chart for details).   DDX: msk strain, ptx, pna, ri fracture, contusion, colitis, perforation, splenic injury  Danna Casella is a 33 y.o. who presents to the ED with symptoms as described above.  Patient arrives uncomfortable appearing with reproducible left upper quadrant pain.  Blood work sent for the but differential was reassuring.  Abdominal exam is overall benign.  Given his sneezing injury and some pain in the left chest wall will order chest x-ray to exclude pneumothorax.  Provide pain medication and reassess.  Clinical Course as of Oct 07 2112  Mon Oct 08, 2019  2109 Patient reassessed repeat abdominal exam benign.  Does seem to be having some pain over the oblique is worsened with movement.  Remains hemodynamically stable.  No pain in the posterior flank.  No pain over the rib cage.  Is not consistent with PE.  No history of injury to suggest splenic injury.  At this point he believe he is stable for outpatient follow-up.  Have discussed with the  patient and available family all diagnostics and treatments performed thus far and all questions were answered to the best of my ability. The patient demonstrates understanding and agreement with plan.    [PR]    Clinical Course User Index [PR] Merlyn Lot, MD    The patient was evaluated in Emergency Department today for the symptoms described in the history of present illness. He/she was evaluated in the context of the global COVID-19 pandemic, which necessitated consideration that the patient might be at risk for infection with the SARS-CoV-2 virus that causes COVID-19. Institutional protocols and algorithms that pertain to the evaluation of patients at risk for COVID-19 are in a state of rapid change based on information released by regulatory bodies including the CDC and federal and state organizations. These policies and algorithms were followed during the patient's care in the ED.  As part of my medical decision making, I reviewed the following data within the [place] notes reviewed and incorporated, Labs reviewed, notes from prior ED visits and [place] Controlled Substance Database   ____________________________________________   FINAL CLINICAL IMPRESSION(S) / ED DIAGNOSES  Final diagnoses:  LUQ abdominal pain      NEW MEDICATIONS STARTED DURING THIS VISIT:  New Prescriptions   CYCLOBENZAPRINE (FLEXERIL) 10 MG TABLET    Take 1 tablet (10 mg total) by mouth 3 (three) times daily as needed for muscle spasms.     Note:  This document was prepared using Dragon voice recognition software and may include unintentional dictation errors.    Merlyn Lot, MD 10/08/19 2113    Merlyn Lot, MD 10/16/19 [phone]

## 2019-10-08 NOTE — ED Triage Notes (Signed)
C/O LUQ abdominal pain today with coughing.  This evening patient states he sneezed and felt a sharp pain to LUQ / Left ribs.  Denies N/V

## 2019-11-19 IMAGING — MR MR SHOULDER*R* W/CM
6 series · 40 of 40 positions shown · IV contrast (agent unspecified)
Comparison: None.

CLINICAL DATA: Anterior shoulder pain.  Popping in the joint.

EXAM:
MR ARTHROGRAM OF THE RIGHT SHOULDER
TECHNIQUE: Multiplanar, multisequence MR imaging of the right shoulder was
performed following the administration of intra-articular contrast.
CONTRAST:  See Injection Documentation.

[Series 3: T1 fat-sat · axial · 4.0mm · 0.47mm/px · z∈[-35,+53]mm · 8 of 21 slices shown (1 of 4)]
[im 1/21]
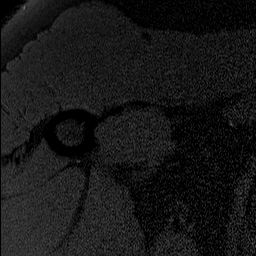
[im 3/21]
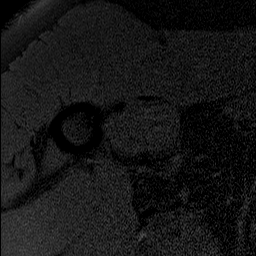
[im 6/21]
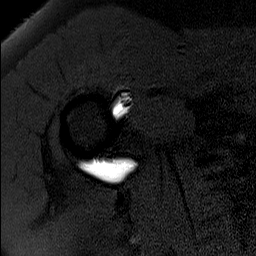
[im 9/21]
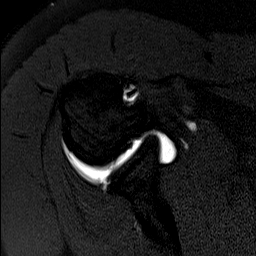
[im 12/21]
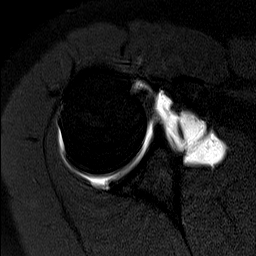
[im 15/21]
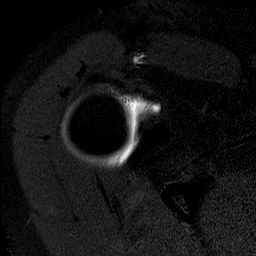
[im 18/21]
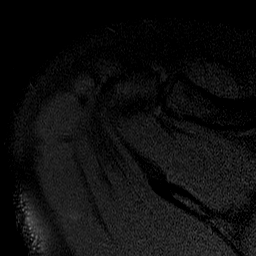
[im 21/21]
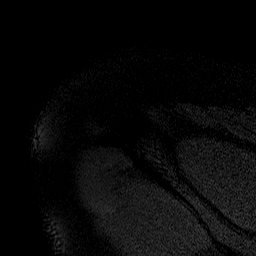

[Series 4: T2 fat-sat · oblique · 4.0mm · 0.62mm/px · 6 of 19 slices shown (1 of 2)]
[im 1/19]
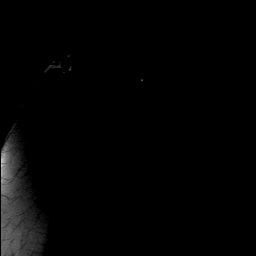
[im 4/19]
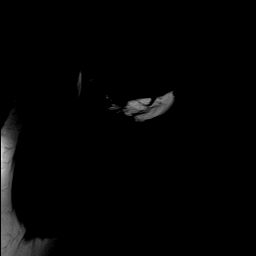
[im 8/19]
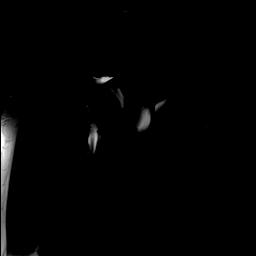
[im 11/19]
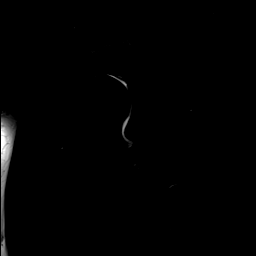
[im 15/19]
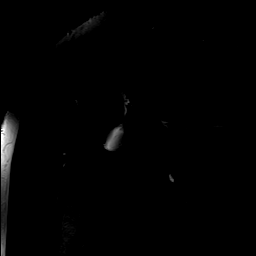
[im 19/19]
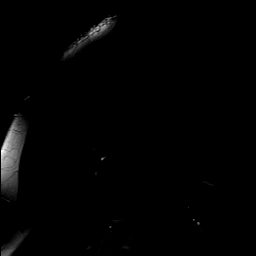

[Series 5: T1 fat-sat · oblique · 4.0mm · 0.62mm/px · 6 of 19 slices shown (2 of 4)]
[im 1/19]
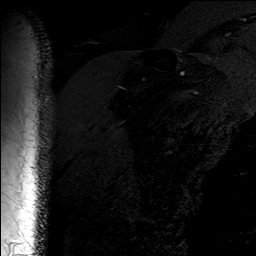
[im 4/19]
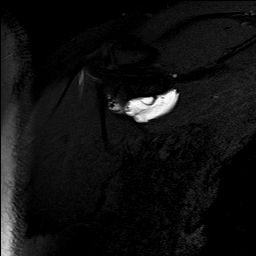
[im 8/19]
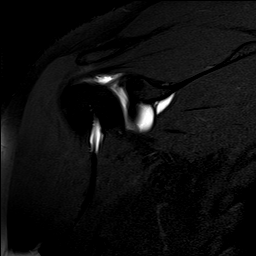
[im 11/19]
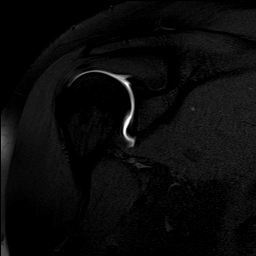
[im 15/19]
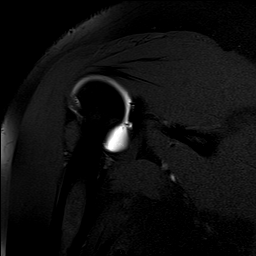
[im 19/19]
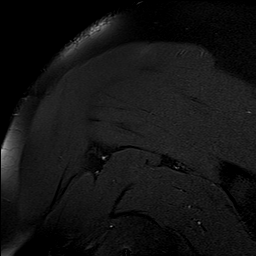

[Series 6: T1 fat-sat · oblique · non-contrast · 4.0mm · 0.42mm/px · 6 of 19 slices shown (3 of 4)]
[im 1/19]
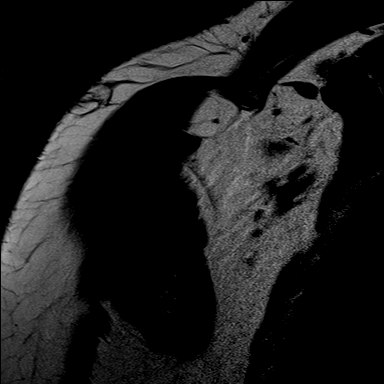
[im 4/19]
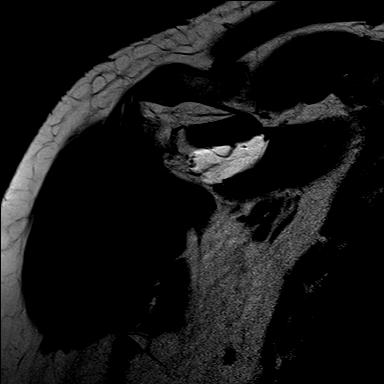
[im 8/19]
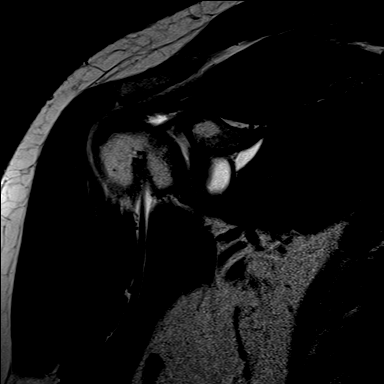
[im 11/19]
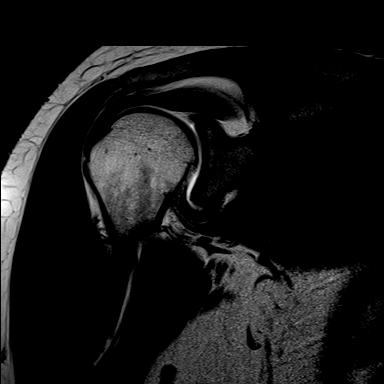
[im 15/19]
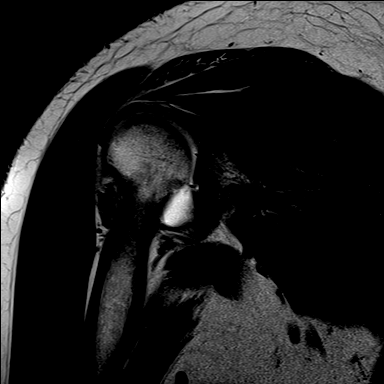
[im 19/19]
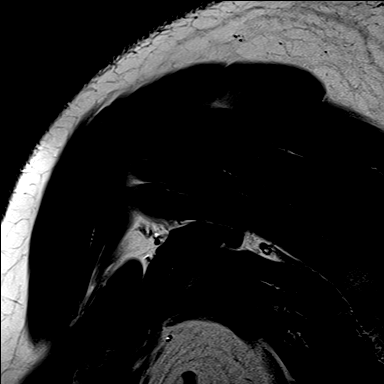

[Series 7: T2 fat-sat · oblique · 4.0mm · 0.62mm/px · 7 of 22 slices shown (2 of 2)]
[im 1/22]
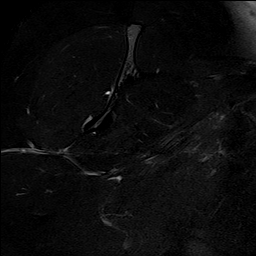
[im 4/22]
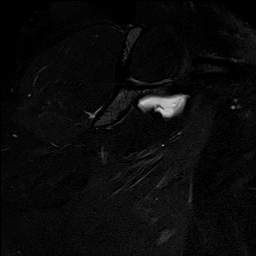
[im 8/22]
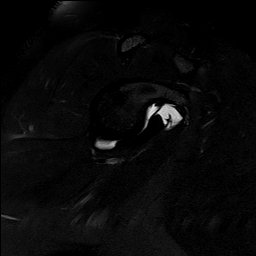
[im 11/22]
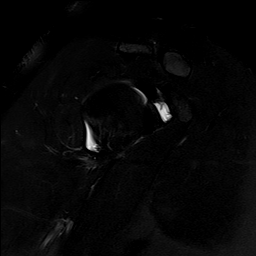
[im 15/22]
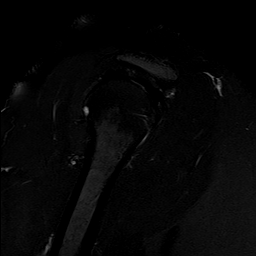
[im 18/22]
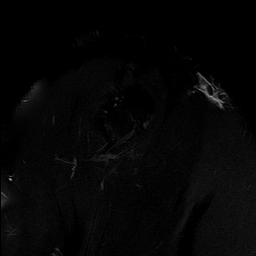
[im 22/22]
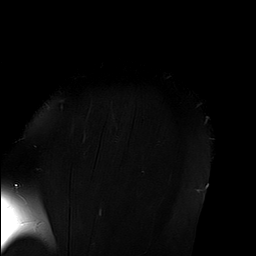

[Series 11: T1 fat-sat · sagittal · 4.0mm · 0.62mm/px · 7 of 22 slices shown (4 of 4)]
[im 1/22]
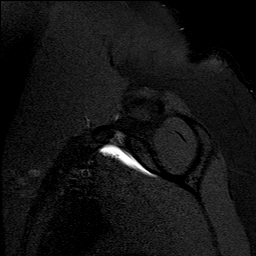
[im 4/22]
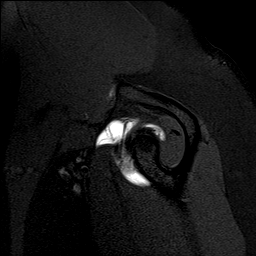
[im 8/22]
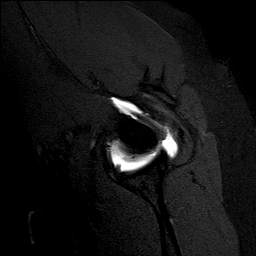
[im 11/22]
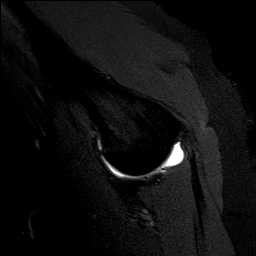
[im 15/22]
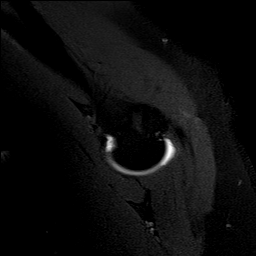
[im 18/22]
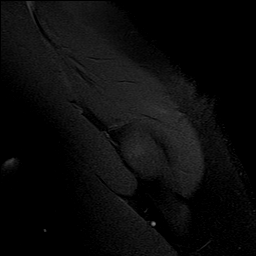
[im 22/22]
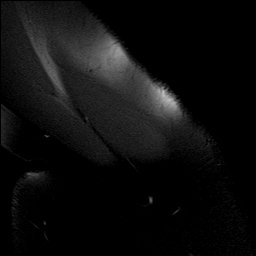

[40 of 40 positions shown; findings below may reference images not displayed]

FINDINGS: Rotator cuff: Supraspinatus tendon is intact. Infraspinatus tendon
is intact. Teres minor tendon is intact. Subscapularis tendon is
intact.

Muscles: No atrophy or fatty replacement of nor abnormal signal
within, the muscles of the rotator cuff.

Biceps long head: Intact.

Acromioclavicular Joint: Normal acromioclavicular joint. Type I
acromion. No subacromial/subdeltoid bursal fluid.

Glenohumeral Joint: Intraarticular contrast distending the joint
capsule. No chondral defect. Normal glenohumeral ligaments.

Labrum: Small posterior inferior labral tear.

Bones: No acute osseous abnormality.  No aggressive osseous lesion.
IMPRESSION: 1. No rotator cuff tear of the right shoulder.
2. Small posterior inferior labral tear.

## 2020-03-01 ENCOUNTER — Other Ambulatory Visit: Payer: Self-pay

## 2020-03-01 ENCOUNTER — Emergency Department
Admission: EM | Admit: 2020-03-01 | Discharge: 2020-03-01 | Disposition: A | Discharge status: Home / Self Care | Payer: Commercial Managed Care - PPO | Attending: Emergency Medicine | Admitting: Emergency Medicine

## 2020-03-01 DIAGNOSIS — F17290 Nicotine dependence, other tobacco product, uncomplicated: Secondary | ICD-10-CM | POA: Insufficient documentation

## 2020-03-01 DIAGNOSIS — H6982 Other specified disorders of Eustachian tube, left ear: Secondary | ICD-10-CM | POA: Insufficient documentation

## 2020-03-01 DIAGNOSIS — H532 Diplopia: Secondary | ICD-10-CM | POA: Insufficient documentation

## 2020-03-01 DIAGNOSIS — H6123 Impacted cerumen, bilateral: Secondary | ICD-10-CM | POA: Insufficient documentation

## 2020-03-01 MED ORDER — OLOPATADINE HCL 0.1 % OP SOLN: 1.0000 [drp] | Freq: Two times a day (BID) | OPHTHALMIC | 1 refills | Status: DC

## 2020-03-01 MED ORDER — PSEUDOEPHEDRINE HCL ER 120 MG PO TB12: 120.0000 mg | ORAL_TABLET | Freq: Two times a day (BID) | ORAL | 2 refills | Status: DC | PRN

## 2020-03-01 MED ORDER — CARBAMIDE PEROXIDE 6.5 % OT SOLN: 5.0000 [drp] | Freq: Two times a day (BID) | OTIC | 0 refills | Status: DC

## 2020-03-01 NOTE — Discharge Instructions (Signed)
Advised to follow-up ophthalmology if no improvement of your diplopia within 2 to 3 days.  Advised using earwax softener for 3 to 5 days before having ears are reirrigated.  Follow discharge care instructions.

## 2020-03-01 NOTE — ED Notes (Signed)
This RN irrigated both ears per verbal order from EDP.

## 2020-03-01 NOTE — ED Provider Notes (Signed)
[facility] Emergency Department Provider Note   ____________________________________________   None    (approximate)  I have reviewed the triage vital signs and the nursing notes.   HISTORY  Chief Complaint Otalgia and Eye Pain    HPI Cory Rocha is a 34 y.o. male patient presents with left ear pain and decreased hearing.  Patient was seen in urgent care clinic this morning and was found to have cerumen impaction.  Patient here today for irrigation.  Patient also complained of double vision which occurred with a.m. awakening.  Patient denies provoking incident for complaint.  Patient is able to drive with complaint.  Patient denies headache or unilateral weakness.         Past Medical History:  Diagnosis Date  . GERD (gastroesophageal reflux disease)    occ    There are no problems to display for this patient.   Past Surgical History:  Procedure Laterality Date  . AMPUTATION Right    partial amputation toe  . BICEPT TENODESIS Right 05/15/2018   Procedure: BICEPS TENODESIS;  Surgeon: Leim Fabry, MD;  Location: [facility] ORS;  Service: Orthopedics;  Laterality: Right;  . HAND SURGERY    . SHOULDER ARTHROSCOPY Right 2016  . SHOULDER ARTHROSCOPY WITH LABRAL REPAIR Right 05/15/2018   Procedure: SHOULDER ARTHROSCOPY WITH LABRAL REPAIR;  Surgeon: Leim Fabry, MD;  Location: [facility] ORS;  Service: Orthopedics;  Laterality: Right;  . TONSILLECTOMY    . umbilical cyst      Prior to Admission medications   Medication Sig Start Date End Date Taking? Authorizing Provider  aspirin-acetaminophen-caffeine (EXCEDRIN MIGRAINE) [phone] MG tablet Take 1 tablet by mouth every 6 (six) hours as needed for headache.    [provider]  carbamide peroxide (DEBROX) 6.5 % OTIC solution Place 5 drops into the right ear 2 (two) times daily. 03/01/20   Sable Feil, PA-C  cyclobenzaprine (FLEXERIL) 10 MG tablet Take 1 tablet (10 mg total) by mouth 3 (three)  times daily as needed for muscle spasms. 10/08/19   Merlyn Lot, MD  ibuprofen (ADVIL,MOTRIN) 200 MG tablet Take 200 mg by mouth every 6 (six) hours as needed.    [provider]  olopatadine (PATANOL) 0.1 % ophthalmic solution Place 1 drop into the left eye 2 (two) times daily. 03/01/20 03/01/21  Sable Feil, PA-C  pseudoephedrine (SUDAFED) 120 MG 12 hr tablet Take 1 tablet (120 mg total) by mouth 2 (two) times daily as needed for congestion. 03/01/20 03/01/21  Sable Feil, PA-C    Allergies Atomoxetine and Tramadol  History reviewed. No pertinent family history.  Social History Social History   Tobacco Use  . Smoking status: Current Every Day Smoker    Types: E-cigarettes  . Smokeless tobacco: Former Systems developer    Quit date: 11/11/2016  Substance Use Topics  . Alcohol use: Not Currently  . Drug use: Never    Review of Systems Constitutional: No fever/chills Eyes: No visual changes.  Double vision left eye. ENT: No sore throat.  Left ear pain and decreased hearing. Cardiovascular: Denies chest pain. Respiratory: Denies shortness of breath. Gastrointestinal: No abdominal pain.  No nausea, no vomiting.  No diarrhea.  No constipation. Genitourinary: Negative for dysuria. Musculoskeletal: Negative for back pain. Skin: Negative for rash. Neurological: Negative for headaches, focal weakness or numbness. Allergic/Immunilogical: Atormoxetine and Tramadol ____________________________________________   PHYSICAL EXAM:  VITAL SIGNS: ED Triage Vitals  Enc Vitals Group     BP 03/01/20 0936 (!) 155/97  Pulse Rate 03/01/20 0936 (!) 109     Resp 03/01/20 0936 18     Temp 03/01/20 0936 98.1 F (36.7 C)     Temp Source 03/01/20 0936 Oral     SpO2 03/01/20 0936 98 %     Weight 03/01/20 0937 279 lb (126.6 kg)     Height 03/01/20 0937 5\' 10"  (1.778 m)     Head Circumference --      Peak Flow --      Pain Score 03/01/20 0937 2     Pain Loc --      Pain Edu? --      Excl.  in GC? --     Constitutional: Alert and oriented. Well appearing and in no acute distress. Eyes: No ptosis or eyelid edema.  Conjunctivae are normal. PERRL. EOMI. See nurses note visual acuity. EARS: TMs not visible secondary to bilateral cerumen impaction. Head: Atraumatic. Nose: No congestion/rhinnorhea. Mouth/Throat: Mucous membranes are moist.  Oropharynx non-erythematous. Cardiovascular: Tachycardic, regular rhythm. Grossly normal heart sounds.  Good peripheral circulation. Respiratory: Normal respiratory effort.  No retractions. Lungs CTAB. Neurologic:  Normal speech and language. No gross focal neurologic deficits are appreciated. No gait instability. Skin:  Skin is warm, dry and intact. No rash noted. Psychiatric: Mood and affect are normal. Speech and behavior are normal.  ____________________________________________   LABS (all labs ordered are listed, but only abnormal results are displayed)  Labs Reviewed - No data to display ____________________________________________  EKG   ____________________________________________  RADIOLOGY  ED MD interpretation:    Official radiology report(s): No results found.  ____________________________________________   PROCEDURES  Procedure(s) performed (including Critical Care):  Procedures   ____________________________________________   INITIAL IMPRESSION / ASSESSMENT AND PLAN / ED COURSE  As part of my medical decision making, I reviewed the following data within the electronic MEDICAL RECORD NUMBER     Patient presents with left ear pain and decreased hearing.  Physical exam is consistent with cerumen impaction.  Nurses only able to dislodge a small amount of cerumen.  Patient also states that his diplopia has improved.  Patient given discharge care instruction.  Patient given a prescription for earwax softener.  Patient advised to use softener for 3 to 5 days and then have ear reirrigated.  Patient also advised to  follow-up ophthalmology if no improvement or worsening of diplopia.  Take medications as directed.         ____________________________________________   FINAL CLINICAL IMPRESSION(S) / ED DIAGNOSES  Final diagnoses:  Monocular diplopia of left eye  Bilateral hearing loss due to cerumen impaction  Acute dysfunction of left eustachian tube     ED Discharge Orders         Ordered    olopatadine (PATANOL) 0.1 % ophthalmic solution  2 times daily     03/01/20 1201    pseudoephedrine (SUDAFED) 120 MG 12 hr tablet  2 times daily PRN     03/01/20 1201    carbamide peroxide (DEBROX) 6.5 % OTIC solution  2 times daily     03/01/20 1201           Note:  This document was prepared using Dragon voice recognition software and may include unintentional dictation errors.    05/01/20, PA-C 03/01/20 1206    05/01/20, MD 03/01/20 [phone]

## 2020-03-01 NOTE — ED Notes (Signed)
Pt states left eye was "stinging" yesterday and woke with this morning with double vision in same eye.

## 2020-03-01 NOTE — ED Triage Notes (Signed)
Pt states L earache x few days. States Fast med sent to ER for further eval. States too much earwax to fully examine. States woke up this AM at 4:30am with double vision to L eye. States eye burning, but drops help it some.

## 2020-06-27 ENCOUNTER — Other Ambulatory Visit: Payer: Self-pay | Admitting: Orthopedic Surgery

## 2020-06-27 DIAGNOSIS — M25512 Pain in left shoulder: Secondary | ICD-10-CM

## 2020-07-21 ENCOUNTER — Ambulatory Visit
Admission: RE | Admit: 2020-07-21 | Discharge: 2020-07-21 | Disposition: A | Discharge status: Home / Self Care | Payer: Commercial Managed Care - PPO | Source: Ambulatory Visit | Attending: Orthopedic Surgery | Admitting: Orthopedic Surgery

## 2020-07-21 ENCOUNTER — Other Ambulatory Visit: Payer: Self-pay

## 2020-07-21 DIAGNOSIS — M25512 Pain in left shoulder: Secondary | ICD-10-CM | POA: Diagnosis present

## 2020-07-21 MED ORDER — GADOBUTROL 1 MMOL/ML IV SOLN
0.0500 mL | Freq: Once | INTRAVENOUS | Status: AC | PRN
  Administered 2020-07-21: 0.05 mL

## 2020-07-21 MED ORDER — LIDOCAINE HCL (PF) 1 % IJ SOLN
5.0000 mL | Freq: Once | INTRAMUSCULAR | Status: AC
  Administered 2020-07-21: 5 mL
  Filled 2020-07-21: qty 5

## 2020-07-21 MED ORDER — SODIUM CHLORIDE (PF) 0.9 % IJ SOLN
10.0000 mL | Freq: Once | INTRAMUSCULAR | Status: AC
  Administered 2020-07-21: 10 mL

## 2020-07-21 MED ORDER — IOHEXOL 180 MG/ML  SOLN
15.0000 mL | Freq: Once | INTRAMUSCULAR | Status: AC | PRN
  Administered 2020-07-21: 15 mL

## 2020-07-22 ENCOUNTER — Other Ambulatory Visit: Payer: Self-pay | Admitting: Orthopedic Surgery

## 2020-07-23 ENCOUNTER — Inpatient Hospital Stay
Admission: RE | Admit: 2020-07-23 | Discharge: 2020-07-23 | Disposition: A | Discharge status: Home / Self Care | Payer: Commercial Managed Care - PPO | Source: Ambulatory Visit

## 2020-07-24 ENCOUNTER — Other Ambulatory Visit: Payer: Self-pay

## 2020-07-24 ENCOUNTER — Other Ambulatory Visit
Admission: RE | Admit: 2020-07-24 | Discharge: 2020-07-24 | Disposition: A | Discharge status: Home / Self Care | Payer: Commercial Managed Care - PPO | Source: Ambulatory Visit | Attending: Orthopedic Surgery | Admitting: Orthopedic Surgery

## 2020-07-24 DIAGNOSIS — Z20822 Contact with and (suspected) exposure to covid-19: Secondary | ICD-10-CM | POA: Diagnosis not present

## 2020-07-24 DIAGNOSIS — Z01812 Encounter for preprocedural laboratory examination: Secondary | ICD-10-CM | POA: Insufficient documentation

## 2020-07-24 LAB — SARS CORONAVIRUS 2 (TAT 6-24 HRS): SARS Coronavirus 2: NEGATIVE

## 2020-07-25 ENCOUNTER — Encounter
Admission: RE | Admit: 2020-07-25 | Discharge: 2020-07-25 | Disposition: A | Discharge status: Home / Self Care | Payer: Commercial Managed Care - PPO | Source: Ambulatory Visit | Attending: Orthopedic Surgery | Admitting: Orthopedic Surgery

## 2020-07-25 NOTE — Patient Instructions (Addendum)
Your procedure is scheduled on: 07/28/20 Report to DAY SURGERY DEPARTMENT LOCATED ON 2ND FLOOR MEDICAL MALL ENTRANCE. To find out your arrival time please call [phone] between 1PM - 3PM on 07/25/20.  Remember: Instructions that are not followed completely may result in serious medical risk, up to and including death, or upon the discretion of your surgeon and anesthesiologist your surgery may need to be rescheduled.     _X__ 1. Do not eat food after midnight the night before your procedure.                 No gum chewing or hard candies. You may drink clear liquids up to 2 hours                 before you are scheduled to arrive for your surgery- DO not drink clear                 liquids within 2 hours of the start of your surgery.                 Clear Liquids include:  water, apple juice without pulp, clear carbohydrate                 drink such as Clearfast or Gatorade, Black Coffee or Tea (Do not add                 anything to coffee or tea). Diabetics water only  __X__2.  On the morning of surgery brush your teeth with toothpaste and water, you                 may rinse your mouth with mouthwash if you wish.  Do not swallow any              toothpaste of mouthwash.     _X__ 3.  No Alcohol for 24 hours before or after surgery.   _X__ 4.  Do Not Smoke or use e-cigarettes For 24 Hours Prior to Your Surgery.                 Do not use any chewable tobacco products for at least 6 hours prior to                 surgery.  ____  5.  Bring all medications with you on the day of surgery if instructed.   __X__  6.  Notify your doctor if there is any change in your medical condition      (cold, fever, infections).     Do not wear jewelry, make-up, hairpins, clips or nail polish. Do not wear lotions, powders, or perfumes.  Do not shave 48 hours prior to surgery. Men may shave face and neck. Do not bring valuables to the hospital.    [facility] is not responsible for any belongings or  valuables.  Contacts, dentures/partials or body piercings may not be worn into surgery. Bring a case for your contacts, glasses or hearing aids, a denture cup will be supplied. Leave your suitcase in the car. After surgery it may be brought to your room. For patients admitted to the hospital, discharge time is determined by your treatment team.   Patients discharged the day of surgery will not be allowed to drive home.   Please read over the following fact sheets that you were given:     __X__ Take these medicines the morning of surgery with A SIP OF WATER:  1. HYDROcodone-acetaminophen (NORCO/VICODIN) 5-325 MG tablet IF NEEDED  2.   3.   4.  5.  6.  ____ Fleet Enema (as directed)   ____ Use CHG Soap/SAGE wipes as directed  ____ Use inhalers on the day of surgery  ____ Stop metformin/Janumet/Farxiga 2 days prior to surgery    ____ Take 1/2 of usual insulin dose the night before surgery. No insulin the morning          of surgery.   ____ Stop Blood Thinners Coumadin/Plavix/Xarelto/Pleta/Pradaxa/Eliquis/Effient/Aspirin  on   Or contact your Surgeon, Cardiologist or Medical Doctor regarding  ability to stop your blood thinners  __X__ Stop Anti-inflammatories 7 days before surgery such as Advil, Ibuprofen, Motrin,  BC or Goodies Powder, Naprosyn, Naproxen, Aleve, Aspirin    __X__ Stop all herbal supplements, fish oil or vitamin E until after surgery. STOP EXCEDRIN, MELOXICAM AND MELATONIN TODAY 07/25/20   ____ Bring C-Pap to the hospital.    TELEPHONE INTERVIEW. VERBAL INSTRUCTIONS PROVIDED ALSO NOTIFIED PATIENT INSTRUCTIONS WILL SHOW UP IN HIS MY CHART ACCOUNT AS AN AFTER VISIT SUMMARY. PATIENT VERBALIZED UNDERSTANDING.    How to Use an Incentive Spirometer An incentive spirometer is a tool that measures how well you are filling your lungs with each breath. Learning to take long, deep breaths using this tool can help you keep your lungs clear and active. This may help to  reverse or lessen your chance of developing breathing (pulmonary) problems, especially infection. You may be asked to use a spirometer:  After a surgery.  If you have a lung problem or a history of smoking.  After a long period of time when you have been unable to move or be active. If the spirometer includes an indicator to show the highest number that you have reached, your health care provider or respiratory therapist will help you set a goal. Keep a list (log) of your progress as told by your health care provider. What are the risks?  Breathing too quickly may cause dizziness or cause you to pass out. Take your time so you do not get dizzy or light-headed.  If you are in pain, you may need to take pain medicine before doing incentive spirometry. It is harder to take a deep breath if you are having pain. How to use your incentive spirometer  1. Sit up on the edge of your bed or on a chair. 2. Hold the incentive spirometer so that it is in an upright position. 3. Before you use the spirometer, breathe out normally. 4. Place the mouthpiece in your mouth. Make sure your lips are closed tightly around it. 5. Breathe in slowly and as deeply as you can through your mouth, causing the piston or the ball to rise toward the top of the chamber. 6. Hold your breath for 3-5 seconds, or for as long as possible. ? If the spirometer includes a coach indicator, use this to guide you in breathing. Slow down your breathing if the indicator goes above the marked areas. 7. Remove the mouthpiece from your mouth and breathe out normally. The piston or ball will return to the bottom of the chamber. 8. Rest for a few seconds, then repeat the steps 10 or more times. ? Take your time and take a few normal breaths between deep breaths so that you do not get dizzy or light-headed. ? Do this every 1-2 hours when you are awake. 9. If the spirometer includes a goal marker to show the highest number  you have reached  (best effort), use this as a goal to work toward during each repetition. 10. After each set of 10 deep breaths, cough a few times. This will help to make sure that your lungs are clear. ? If you have an incision on your chest or abdomen from surgery, place a pillow or a rolled-up towel firmly against the incision when you cough. This can help to reduce pain from coughing. General tips  When you become able to get out of bed, walk around often and continue to cough to help clear your lungs.  Keep using the incentive spirometer until your health care provider says it is okay to stop using it. If you have been in the hospital, you may be told to keep using the spirometer at home. Contact a health care provider if:  You are having difficulty using the spirometer.  You have trouble using the spirometer as often as instructed.  Your pain medicine is not giving enough relief for you to use the spirometer as told.  You have a fever.  You develop shortness of breath. Get help right away if:  You develop a cough with bloody mucus from the lungs (bloody sputum).  You have fluid or blood coming from an incision site after you cough. Summary  An incentive spirometer is a tool that can help you learn to take long, deep breaths to keep your lungs clear and active.  You may be asked to use a spirometer after a surgery, if you have a lung problem or a history of smoking, or if you have been inactive for a long period of time.  Use your incentive spirometer as instructed every 1-2 hours while you are awake.  If you have an incision on your chest or abdomen, place a pillow or a rolled-up towel firmly against your incision when you cough. This will help to reduce pain. This information is not intended to replace advice given to you by your health care provider. Make sure you discuss any questions you have with your health care provider. Document Revised: 05/18/2019 Document Reviewed:  08/31/2017 Elsevier Patient Education  2020 [employer].

## 2020-07-28 ENCOUNTER — Encounter
Admission: RE | Disposition: A | Discharge status: Home / Self Care | Payer: Self-pay | Source: Ambulatory Visit | Attending: Orthopedic Surgery

## 2020-07-28 ENCOUNTER — Other Ambulatory Visit: Payer: Self-pay

## 2020-07-28 ENCOUNTER — Encounter: Payer: Self-pay | Admitting: Orthopedic Surgery

## 2020-07-28 ENCOUNTER — Ambulatory Visit
Admission: RE | Admit: 2020-07-28 | Discharge: 2020-07-28 | Disposition: A | Discharge status: Home / Self Care | Payer: Commercial Managed Care - PPO | Source: Ambulatory Visit | Attending: Orthopedic Surgery | Admitting: Orthopedic Surgery

## 2020-07-28 ENCOUNTER — Ambulatory Visit: Payer: Self-pay

## 2020-07-28 ENCOUNTER — Ambulatory Visit: Payer: Commercial Managed Care - PPO | Admitting: Certified Registered Nurse Anesthetist

## 2020-07-28 DIAGNOSIS — M65812 Other synovitis and tenosynovitis, left shoulder: Secondary | ICD-10-CM | POA: Diagnosis not present

## 2020-07-28 DIAGNOSIS — F17290 Nicotine dependence, other tobacco product, uncomplicated: Secondary | ICD-10-CM | POA: Diagnosis not present

## 2020-07-28 DIAGNOSIS — M25512 Pain in left shoulder: Secondary | ICD-10-CM

## 2020-07-28 DIAGNOSIS — Z888 Allergy status to other drugs, medicaments and biological substances status: Secondary | ICD-10-CM | POA: Insufficient documentation

## 2020-07-28 DIAGNOSIS — X58XXXA Exposure to other specified factors, initial encounter: Secondary | ICD-10-CM | POA: Insufficient documentation

## 2020-07-28 DIAGNOSIS — S43402A Unspecified sprain of left shoulder joint, initial encounter: Secondary | ICD-10-CM | POA: Diagnosis present

## 2020-07-28 DIAGNOSIS — Z6841 Body Mass Index (BMI) 40.0 and over, adult: Secondary | ICD-10-CM | POA: Diagnosis not present

## 2020-07-28 DIAGNOSIS — S43492A Other sprain of left shoulder joint, initial encounter: Secondary | ICD-10-CM | POA: Insufficient documentation

## 2020-07-28 DIAGNOSIS — Z885 Allergy status to narcotic agent status: Secondary | ICD-10-CM | POA: Insufficient documentation

## 2020-07-28 HISTORY — PX: SHOULDER ARTHROSCOPY WITH LABRAL REPAIR: SHX5691

## 2020-07-28 SURGERY — ARTHROSCOPY, SHOULDER, WITH GLENOID LABRUM REPAIR
Anesthesia: General | Site: Shoulder | Laterality: Left

## 2020-07-28 MED ORDER — ONDANSETRON 4 MG PO TBDP: 4.0000 mg | ORAL_TABLET | Freq: Three times a day (TID) | ORAL | 0 refills | Status: DC | PRN

## 2020-07-28 MED ORDER — MIDAZOLAM HCL 2 MG/2ML IJ SOLN
INTRAMUSCULAR | Status: AC
  Administered 2020-07-28: 1 mg via INTRAVENOUS
  Filled 2020-07-28: qty 2

## 2020-07-28 MED ORDER — ACETAMINOPHEN 10 MG/ML IV SOLN
INTRAVENOUS | Status: AC
  Filled 2020-07-28: qty 100

## 2020-07-28 MED ORDER — LACTATED RINGERS IV SOLN: INTRAVENOUS | Status: DC

## 2020-07-28 MED ORDER — LIDOCAINE HCL (PF) 1 % IJ SOLN
INTRAMUSCULAR | Status: DC | PRN
  Administered 2020-07-28: 3 mL

## 2020-07-28 MED ORDER — OXYCODONE HCL 5 MG PO TABS
ORAL_TABLET | ORAL | Status: AC
  Filled 2020-07-28: qty 1

## 2020-07-28 MED ORDER — ONDANSETRON HCL 4 MG/2ML IJ SOLN: 4.0000 mg | Freq: Once | INTRAMUSCULAR | Status: DC | PRN

## 2020-07-28 MED ORDER — FAMOTIDINE 20 MG PO TABS: 20.0000 mg | ORAL_TABLET | Freq: Once | ORAL | Status: AC

## 2020-07-28 MED ORDER — PROPOFOL 10 MG/ML IV BOLUS
INTRAVENOUS | Status: AC
  Filled 2020-07-28: qty 20

## 2020-07-28 MED ORDER — FENTANYL CITRATE (PF) 250 MCG/5ML IJ SOLN
INTRAMUSCULAR | Status: DC | PRN
Start: 2020-07-28 — End: 2020-07-28
  Administered 2020-07-28: 50 ug via INTRAVENOUS

## 2020-07-28 MED ORDER — MIDAZOLAM HCL 2 MG/2ML IJ SOLN
INTRAMUSCULAR | Status: AC
  Filled 2020-07-28: qty 2

## 2020-07-28 MED ORDER — LIDOCAINE HCL (PF) 2 % IJ SOLN
INTRAMUSCULAR | Status: AC
  Filled 2020-07-28: qty 10

## 2020-07-28 MED ORDER — LIDOCAINE HCL 4 % MT SOLN
OROMUCOSAL | Status: DC | PRN
  Administered 2020-07-28: 4 mL via TOPICAL

## 2020-07-28 MED ORDER — FENTANYL CITRATE (PF) 100 MCG/2ML IJ SOLN
INTRAMUSCULAR | Status: AC
  Administered 2020-07-28: 50 ug via INTRAVENOUS
  Filled 2020-07-28: qty 2

## 2020-07-28 MED ORDER — PHENYLEPHRINE HCL (PRESSORS) 10 MG/ML IV SOLN
INTRAVENOUS | Status: AC
  Filled 2020-07-28: qty 1

## 2020-07-28 MED ORDER — FENTANYL CITRATE (PF) 100 MCG/2ML IJ SOLN
INTRAMUSCULAR | Status: AC
  Filled 2020-07-28: qty 2

## 2020-07-28 MED ORDER — OXYCODONE HCL 5 MG PO TABS
5.0000 mg | ORAL_TABLET | Freq: Once | ORAL | Status: AC
  Administered 2020-07-28: 5 mg via ORAL
  Filled 2020-07-28: qty 1

## 2020-07-28 MED ORDER — FENTANYL CITRATE (PF) 100 MCG/2ML IJ SOLN
25.0000 ug | INTRAMUSCULAR | Status: DC | PRN
  Administered 2020-07-28 (×3): 25 ug via INTRAVENOUS

## 2020-07-28 MED ORDER — EPINEPHRINE PF 1 MG/ML IJ SOLN
INTRAMUSCULAR | Status: AC
  Filled 2020-07-28: qty 4

## 2020-07-28 MED ORDER — ONDANSETRON HCL 4 MG/2ML IJ SOLN
INTRAMUSCULAR | Status: DC | PRN
  Administered 2020-07-28: 4 mg via INTRAVENOUS

## 2020-07-28 MED ORDER — BUPIVACAINE LIPOSOME 1.3 % IJ SUSP
INTRAMUSCULAR | Status: AC
  Filled 2020-07-28: qty 20

## 2020-07-28 MED ORDER — SUGAMMADEX SODIUM 200 MG/2ML IV SOLN
INTRAVENOUS | Status: DC | PRN
  Administered 2020-07-28: 200 mg via INTRAVENOUS

## 2020-07-28 MED ORDER — PROPOFOL 10 MG/ML IV BOLUS
INTRAVENOUS | Status: DC | PRN
  Administered 2020-07-28: 200 mg via INTRAVENOUS

## 2020-07-28 MED ORDER — ACETAMINOPHEN 500 MG PO TABS: 1000.0000 mg | ORAL_TABLET | Freq: Three times a day (TID) | ORAL | 2 refills | Status: AC

## 2020-07-28 MED ORDER — CHLORHEXIDINE GLUCONATE 0.12 % MT SOLN
OROMUCOSAL | Status: AC
  Administered 2020-07-28: 15 mL via OROMUCOSAL
  Filled 2020-07-28: qty 15

## 2020-07-28 MED ORDER — ROCURONIUM BROMIDE 10 MG/ML (PF) SYRINGE
PREFILLED_SYRINGE | INTRAVENOUS | Status: AC
  Filled 2020-07-28: qty 10

## 2020-07-28 MED ORDER — ORAL CARE MOUTH RINSE: 15.0000 mL | Freq: Once | OROMUCOSAL | Status: AC

## 2020-07-28 MED ORDER — BUPIVACAINE HCL (PF) 0.5 % IJ SOLN
INTRAMUSCULAR | Status: DC | PRN
  Administered 2020-07-28: 10 mL via PERINEURAL

## 2020-07-28 MED ORDER — ACETAMINOPHEN 10 MG/ML IV SOLN
INTRAVENOUS | Status: DC | PRN
  Administered 2020-07-28: 1000 mg via INTRAVENOUS

## 2020-07-28 MED ORDER — ONDANSETRON HCL 4 MG/2ML IJ SOLN
INTRAMUSCULAR | Status: AC
  Filled 2020-07-28: qty 2

## 2020-07-28 MED ORDER — SUCCINYLCHOLINE CHLORIDE 20 MG/ML IJ SOLN
INTRAMUSCULAR | Status: DC | PRN
  Administered 2020-07-28: 140 mg via INTRAVENOUS

## 2020-07-28 MED ORDER — PHENYLEPHRINE HCL (PRESSORS) 10 MG/ML IV SOLN
INTRAVENOUS | Status: DC | PRN
  Administered 2020-07-28 (×2): 100 ug via INTRAVENOUS
  Administered 2020-07-28: 200 ug via INTRAVENOUS

## 2020-07-28 MED ORDER — BUPIVACAINE HCL (PF) 0.5 % IJ SOLN
INTRAMUSCULAR | Status: AC
  Filled 2020-07-28: qty 10

## 2020-07-28 MED ORDER — LACTATED RINGERS IV SOLN
INTRAVENOUS | Status: DC | PRN
  Administered 2020-07-28: 4 mL

## 2020-07-28 MED ORDER — FENTANYL CITRATE (PF) 100 MCG/2ML IJ SOLN: 50.0000 ug | Freq: Once | INTRAMUSCULAR | Status: AC

## 2020-07-28 MED ORDER — DEXAMETHASONE SODIUM PHOSPHATE 10 MG/ML IJ SOLN
INTRAMUSCULAR | Status: DC | PRN
  Administered 2020-07-28: 10 mg via INTRAVENOUS

## 2020-07-28 MED ORDER — OXYCODONE HCL 5 MG PO TABS: 5.0000 mg | ORAL_TABLET | ORAL | 0 refills | Status: AC | PRN

## 2020-07-28 MED ORDER — CHLORHEXIDINE GLUCONATE 0.12 % MT SOLN: 15.0000 mL | Freq: Once | OROMUCOSAL | Status: AC

## 2020-07-28 MED ORDER — MIDAZOLAM HCL 2 MG/2ML IJ SOLN
INTRAMUSCULAR | Status: DC | PRN
  Administered 2020-07-28: 2 mg via INTRAVENOUS

## 2020-07-28 MED ORDER — DEXTROSE 5 % IV SOLN
3.0000 g | INTRAVENOUS | Status: AC
  Administered 2020-07-28: 3 g via INTRAVENOUS
  Filled 2020-07-28: qty 3

## 2020-07-28 MED ORDER — DEXAMETHASONE SODIUM PHOSPHATE 10 MG/ML IJ SOLN
INTRAMUSCULAR | Status: AC
  Filled 2020-07-28: qty 1

## 2020-07-28 MED ORDER — LIDOCAINE HCL (CARDIAC) PF 100 MG/5ML IV SOSY
PREFILLED_SYRINGE | INTRAVENOUS | Status: DC | PRN
  Administered 2020-07-28: 100 mg via INTRAVENOUS

## 2020-07-28 MED ORDER — LIDOCAINE HCL (PF) 1 % IJ SOLN
INTRAMUSCULAR | Status: AC
  Filled 2020-07-28: qty 5

## 2020-07-28 MED ORDER — FAMOTIDINE 20 MG PO TABS
ORAL_TABLET | ORAL | Status: AC
  Administered 2020-07-28: 20 mg via ORAL
  Filled 2020-07-28: qty 1

## 2020-07-28 MED ORDER — MIDAZOLAM HCL 2 MG/2ML IJ SOLN: 1.0000 mg | Freq: Once | INTRAMUSCULAR | Status: AC

## 2020-07-28 MED ORDER — BUPIVACAINE LIPOSOME 1.3 % IJ SUSP
INTRAMUSCULAR | Status: DC | PRN
  Administered 2020-07-28: 20 mL via PERINEURAL

## 2020-07-28 MED ORDER — ROCURONIUM BROMIDE 100 MG/10ML IV SOLN
INTRAVENOUS | Status: DC | PRN
  Administered 2020-07-28: 50 mg via INTRAVENOUS
  Administered 2020-07-28: 10 mg via INTRAVENOUS

## 2020-07-28 MED ORDER — LIDOCAINE HCL (PF) 1 % IJ SOLN
INTRAMUSCULAR | Status: AC
  Filled 2020-07-28: qty 30

## 2020-07-28 SURGICAL SUPPLY — 62 items
ADAPTER IRRIG TUBE 2 SPIKE SOL (ADAPTER) ×6 IMPLANT
ADPR TBG 2 SPK PMP STRL ASCP (ADAPTER) ×2
ANCHOR SUT 1.8 FBRTK KNTLS 2SU (Anchor) ×15 IMPLANT
APL PRP STRL LF DISP 70% ISPRP (MISCELLANEOUS) ×1
BIT DRILL RIGD1.8MM FBRTK STRL (DRILL) ×1 IMPLANT
BUR RADIUS 4.0X18.5 (BURR) ×3 IMPLANT
BUR RADIUS 5.5 (BURR) ×3 IMPLANT
CANNULA 5.75X7CM (CANNULA) ×2
CANNULA PART THRD DISP 5.75X7 (CANNULA) ×4 IMPLANT
CANNULA PARTIAL THREAD 2X7 (CANNULA) ×3 IMPLANT
CANNULA TWIST IN 8.25X9CM (CANNULA) IMPLANT
CHLORAPREP W/TINT 26 (MISCELLANEOUS) ×3 IMPLANT
COOLER POLAR GLACIER W/PUMP (MISCELLANEOUS) ×3 IMPLANT
DRAPE 3/4 80X56 (DRAPES) ×3 IMPLANT
DRAPE IMP U-DRAPE 54X76 (DRAPES) ×6 IMPLANT
DRAPE INCISE IOBAN 66X45 STRL (DRAPES) ×3 IMPLANT
DRAPE U-SHAPE 47X51 STRL (DRAPES) ×3 IMPLANT
DRILL RIGID 1.8MM FBRTK STRL (DRILL) ×3
DRSG TEGADERM 2X2.25 PEDS (GAUZE/BANDAGES/DRESSINGS) ×3 IMPLANT
DRSG TEGADERM 4X4.75 (GAUZE/BANDAGES/DRESSINGS) ×6 IMPLANT
ELECT REM PT RETURN 9FT ADLT (ELECTROSURGICAL)
ELECTRODE REM PT RTRN 9FT ADLT (ELECTROSURGICAL) IMPLANT
GAUZE 4X4 16PLY RFD (DISPOSABLE) ×3 IMPLANT
GAUZE SPONGE 4X4 12PLY STRL (GAUZE/BANDAGES/DRESSINGS) ×3 IMPLANT
GAUZE XEROFORM 1X8 LF (GAUZE/BANDAGES/DRESSINGS) ×3 IMPLANT
GLOVE BIOGEL PI IND STRL 8 (GLOVE) ×1 IMPLANT
GLOVE BIOGEL PI INDICATOR 8 (GLOVE) ×2
GLOVE SURG ORTHO 8.0 STRL STRW (GLOVE) ×6 IMPLANT
GLOVE SURG SYN 8.0 (GLOVE) ×3 IMPLANT
GOWN STRL REUS W/ TWL LRG LVL3 (GOWN DISPOSABLE) ×1 IMPLANT
GOWN STRL REUS W/TWL LRG LVL3 (GOWN DISPOSABLE) ×3
GOWN STRL REUS W/TWL XL LVL3 (GOWN DISPOSABLE) ×3 IMPLANT
IV LACTATED RINGER IRRG 3000ML (IV SOLUTION) ×27
IV LR IRRIG 3000ML ARTHROMATIC (IV SOLUTION) ×9 IMPLANT
KIT CVD SPEAR FBRTK 1.8 DRILL (KITS) ×3 IMPLANT
KIT STABILIZATION SHOULDER (MISCELLANEOUS) ×3 IMPLANT
KIT SUTURETAK 3.0 INSERT PERC (KITS) IMPLANT
KIT TURNOVER KIT A (KITS) ×3 IMPLANT
LASSO 25 DEG QUICKPASS CVD LT (SUTURE) ×3 IMPLANT
MANIFOLD NEPTUNE II (INSTRUMENTS) ×3 IMPLANT
MASK FACE SPIDER DISP (MASK) ×3 IMPLANT
MAT ABSORB  FLUID 56X50 GRAY (MISCELLANEOUS) ×4
MAT ABSORB FLUID 56X50 GRAY (MISCELLANEOUS) ×2 IMPLANT
PACK SHDR ARTHRO (MISCELLANEOUS) ×3 IMPLANT
PAD ABD DERMACEA PRESS 5X9 (GAUZE/BANDAGES/DRESSINGS) IMPLANT
PAD WRAPON POLAR SHDR XLG (MISCELLANEOUS) ×1 IMPLANT
SET TUBE SUCT SHAVER OUTFL 24K (TUBING) ×3 IMPLANT
SET TUBE TIP INTRA-ARTICULAR (MISCELLANEOUS) ×3 IMPLANT
SLING ULTRA II M (MISCELLANEOUS) IMPLANT
SUT ETHILON 4-0 (SUTURE) ×6
SUT ETHILON 4-0 FS2 18XMFL BLK (SUTURE) ×2
SUT LASSO 90 DEG SD STR (SUTURE) IMPLANT
SUT MNCRL 4-0 (SUTURE)
SUT MNCRL 4-0 27XMFL (SUTURE)
SUTURE ETHLN 4-0 FS2 18XMF BLK (SUTURE) ×2 IMPLANT
SUTURE MNCRL 4-0 27XMF (SUTURE) IMPLANT
TAPE MICROFOAM 4IN (TAPE) ×3 IMPLANT
TUBING ARTHRO INFLOW-ONLY STRL (TUBING) ×3 IMPLANT
TUBING CONNECTING 10 (TUBING) ×2 IMPLANT
TUBING CONNECTING 10' (TUBING) ×1
WAND WEREWOLF FLOW 90D (MISCELLANEOUS) IMPLANT
WRAPON POLAR PAD SHDR XLG (MISCELLANEOUS) ×3

## 2020-07-28 NOTE — Anesthesia Procedure Notes (Signed)
Anesthesia Regional Block: Interscalene brachial plexus block   Pre-Anesthetic Checklist: ,, timeout performed, Correct Patient, Correct Site, Correct Laterality, Correct Procedure, Correct Position, site marked, Risks and benefits discussed,  Surgical consent,  Pre-op evaluation,  At surgeon's request and post-op pain management  Laterality: Left  Prep: chloraprep       Needles:  Injection technique: Single-shot  Needle Type: Echogenic Stimulator Needle     Needle Length: 10cm  Needle Gauge: 20     Additional Needles:   Procedures:, nerve stimulator,,, ultrasound used (permanent image in chart),,,,  Narrative:  Start time: 07/28/2020 10:32 AM End time: 07/28/2020 10:38 AM Injection made incrementally with aspirations every 5 mL.  Performed by: Personally   Additional Notes: Functioning IV was confirmed and monitors were applied.Sterile prep and drape,hand hygiene and sterile gloves were used.  Negative aspiration and negative test dose prior to incremental administration of local anesthetic. The patient tolerated the procedure well.

## 2020-07-28 NOTE — Anesthesia Procedure Notes (Signed)
Procedure Name: Intubation Date/Time: 07/28/2020 12:22 PM Performed by: Frazier, Susan, CRNA Pre-anesthesia Checklist: Patient identified, Emergency Drugs available, Suction available and Patient being monitored Patient Re-evaluated:Patient Re-evaluated prior to induction Oxygen Delivery Method: Circle system utilized Preoxygenation: Pre-oxygenation with 100% oxygen Induction Type: IV induction Ventilation: Mask ventilation without difficulty and Oral airway inserted - appropriate to patient size Laryngoscope Size: McGraph and 4 Grade View: Grade I Tube type: Oral Tube size: 8.0 mm Number of attempts: 1 Airway Equipment and Method: Stylet,  Video-laryngoscopy and LTA kit utilized Placement Confirmation: ETT inserted through vocal cords under direct vision,  positive ETCO2 and breath sounds checked- equal and bilateral Secured at: 22 cm Tube secured with: Tape Dental Injury: Teeth and Oropharynx as per pre-operative assessment        

## 2020-07-28 NOTE — Transfer of Care (Signed)
Immediate Anesthesia Transfer of Care Note  Patient: [place]  Procedure(s) Performed: Left posterior labral repair with possible biceps tenodesis - Dedra Skeens to Assist (Left Shoulder)  Patient Location: PACU  Anesthesia Type:GA combined with regional for post-op pain  Level of Consciousness: awake, alert , oriented and patient cooperative  Airway & Oxygen Therapy: Patient Spontanous Breathing and Patient connected to face mask oxygen  Post-op Assessment: Report given to RN and Post -op Vital signs reviewed and stable  Post vital signs: Reviewed and stable  Last Vitals:  Vitals Value Taken Time  BP 113/64 07/28/20 1420  Temp 36.9 C 07/28/20 1420  Pulse 88 07/28/20 1424  Resp 16 07/28/20 1424  SpO2 100 % 07/28/20 1424  Vitals shown include unvalidated device data.  Last Pain:  Vitals:   07/28/20 1420  TempSrc:   PainSc: (P) 0-No pain         Complications: No complications documented.

## 2020-07-28 NOTE — Discharge Instructions (Signed)
Post-Op Instructions  1. Bracing: You will wear a shoulder immobilizer or sling for at least 3-4 weeks. Total time will be determined by type of surgical repair and can be discussed at 1st postop appointment.  2. Driving: No driving for 3-4 weeks post-op. When driving, do not wear the immobilizer.  3. Activity: No active lifting for 2 months. Wrist, hand, and elbow motion only. Avoid lifting the upper arm away from the body except for hygiene. You are permitted to bend and straighten the elbow actively. You may use your hand and wrist for typing, writing, and managing utensils (cutting food). Do not lift more than a coffee cup for 8 weeks.  When sleeping or resting, inclined positions (recliner chair or wedge pillow) and a pillow under the forearm for support may provide better comfort for up to 4 weeks.  Avoid long distance travel for 4 weeks.  Return to normal activities after labral repair normally takes 6 months on average. If rehab goes very well, may be able to do most activities at 4 months, except overhead or contact sports.  4. Physical Therapy: Begins 3-4 days after surgery, and proceeds 2 times per week for the first 4 weeks, then 1-2 times per week from weeks 4-8 post-op.  5. Medications:  - You will be provided a prescription for narcotic pain medicine. After surgery, take 1-2 narcotic tablets every 4 hours if needed for severe pain.  - A prescription for anti-nausea medication will be provided in case the narcotic medicine causes nausea - take 1 tablet every 6 hours only if nauseated.   - Take tylenol 1000 mg every 8 hours for pain.  May stop tylenol 3 days after surgery if you are having minimal pain.  If you are taking prescription medication for anxiety, depression, insomnia, muscle spasm, chronic pain, or for attention deficit disorder, you are advised that you are at a higher risk of adverse effects with use of narcotics post-op, including narcotic addiction/dependence, depressed  breathing, death. If you use non-prescribed substances: alcohol, marijuana, cocaine, heroin, methamphetamines, etc., you are at a higher risk of adverse effects with use of narcotics post-op, including narcotic addiction/dependence, depressed breathing, death. You are advised that taking > 50 morphine milligram equivalents (MME) of narcotic pain medication per day results in twice the risk of overdose or death. For your prescription provided: oxycodone 5 mg - taking more than 6 tablets per day would result in > 50 morphine milligram equivalents (MME) of narcotic pain medication. Be advised that we will prescribe narcotics short-term, for acute post-operative pain only - 3 weeks for major operations such as shoulder repair/reconstruction surgeries.   6. Post-Op Appointment:  Your first post-op appointment will be 10-14 days post-op.  7. Work or School: For most, but not all procedures, we advise staying out of work or school for at least 1 to 2 weeks in order to recover from the stress of surgery and to allow time for healing.   If you need a work or school note this can be provided.   Post-operative Brace: Apply and remove the brace you received as you were instructed to at the time of fitting and as described in detail as the brace's instructions for use indicate.  Wear the brace for the period of time prescribed by your physician.  The brace can be cleaned with soap and water and allowed to air dry only.  Should the brace result in increased pain, decreased feeling (numbness/tingling), increased swelling or an overall worsening  of your medical condition, please contact your doctor immediately.  If an emergency situation occurs as a result of wearing the brace after normal business hours, please dial 911 and seek immediate medical attention.  Let your doctor know if you have any further questions about the brace issued to you. Refer to the shoulder sling instructions for use if you have any questions  regarding the correct fit of your shoulder sling.  [facility] Customer Care for Troubleshooting: [phone]  Video that illustrates how to properly use a shoulder sling: "Instructions for Proper Use of an Orthopaedic Sling" http://bass.com/   AMBULATORY SURGERY  DISCHARGE INSTRUCTIONS   1) The drugs that you were given will stay in your system until tomorrow so for the next 24 hours you should not:  A) Drive an automobile B) Make any legal decisions C) Drink any alcoholic beverage   2) You may resume regular meals tomorrow.  Today it is better to start with liquids and gradually work up to solid foods.  You may eat anything you prefer, but it is better to start with liquids, then soup and crackers, and gradually work up to solid foods.   3) Please notify your doctor immediately if you have any unusual bleeding, trouble breathing, redness and pain at the surgery site, drainage, fever, or pain not relieved by medication.    4) Additional Instructions:        Please contact your physician with any problems or Same Day Surgery at [phone], Monday through Friday 6 am to 4 pm, or [place] [facility] number at [phone].

## 2020-07-28 NOTE — H&P (Signed)
Paper H&P to be scanned into permanent record. H&P reviewed. No significant changes noted.  

## 2020-07-28 NOTE — Op Note (Signed)
DATE: 07/28/2020    PRE-OP DIAGNOSIS:  1. Left  shoulder posterior labral tear   POST-OP DIAGNOSIS:  1. Left  shoulder posterior labral tear   PROCEDURES:  1. Left shoulder arthroscopic posterior labral repair and capsulorraphy 2. Left shoulder extensive glenohumeral debridement   SURGEON: Rosealee Albee, MD   ASSISTANT: Sonny Dandy, PA   ANESTHESIA: Regional interscalene block with Exparil + Gen    TOTAL IV FLUIDS: per anesthesia record   ESTIMATED BLOOD LOSS: minimal     DRAINS: None    SPECIMENS: None.    IMPLANTS:  Arthrex Knotless 1.38mm FiberTak suture anchor x5    COMPLICATIONS: None    Indications:  Cory Rocha is a 34 y.o. male with a history of R shoulder pain for over 3 months that did not respond to conservative management in the form of prescription strength NSAIDs and home exercises.  Of note, the patient had a prior right shoulder arthroscopic posterior labral repair initially performed in 2016 by another surgeon that had to be revised by me in July 2019.  He has done well on the right shoulder since that time.  He states that his symptoms feel very similar on the left side.   Clinical exam and imaging was suggestive of left shoulder posterior labral tear with posterior subluxation.  After discussion of risks, benefits, and alternatives to surgery, the patient elected to proceed.  They understand that there is a chance that there was no significant pathology after diagnostic arthroscopy.   Procedure Details  The patient was seen in the Holding Room. The risks, benefits, complications, treatment options, and expected outcomes were discussed with the patient. The risks and potential complications of the problem and proposed treatment were discussed. This includes but is not limited to failure to fully relieve pain, continued or recurrence of pain,, infection, neurovascular compromise, complications from anesthesia, stiff shoulder, bleeding, DVT, and reoperation. The  patient concurred with the proposed plan, giving informed consent. The site of surgery was properly noted/marked.   The patient was placed on the OR bed in the beach chair position using a beanbag. All bony prominences were well padded. The patient was given appropriate preoperative IV antibiotics. The upper extremity was prescrubbed with alcohol, prepped with Chloroprep, and draped in the usual sterile fashion. A Time Out Procedure was performed confirming correct patient, procedure, and laterality.   Exam under anesthesia showed: laxity anterior 1+, posterior 2+. 170FF, 70 ER with arm at side   Glenohumeral portals were marked. An 11 blade was used to make stab incisions in the posterior soft spot for the standard posterior viewing portal. I made an additional low anterior portal, which was used as a passing portal for suture management.  Additionally, a high anterolateral portal was made, which would serve as the viewing portal for the remainder of the surgery. We performed a thorough arthroscopic evaluation of the glenohumeral joint through both the anterior and posterior viewing portals.    Glenohumeral Joint: Articular cartilage of the humeral head and glenoid: mild fraying of the glenoid Synovium: injected and erythematous, especially posteriorly and about the rotator interval  Anterior Labrum: normal  Posterior Labrum: posterior labral tear extending from 10:00 o'clock position to 6:00 o'clock position Superior Labrum:  Normal Inferior Labrum: normal anterior to 6:00 o'clock position Anterior Capsule: normal Inferior Capsule: normal  Posterior Capsule: patulous  Rotator interval: Significant synovitis Superior glenohumeral ligament: normal  Middle glenohumeral ligament: normal  Inferior glenohumeral ligament: normal. No HAGL. Biceps:  Normal,  except for very mild erythema in the bicipital groove Rotator cuff findings: Mild fraying of the superior subscapularis without disruption of the  lesser tuberosity footprint; normal supraspinatus and infraspinatus.   The elbow was placed in a position of slight anterior and inferior traction to allow for improved visualization. Next, a portal of [place] was made along the posterior 1/3 of the acromion just inferior to the lateral edge using a spinal needle to confirm appropriate positioning.  A 65mm cannula was placed in the portal of [place]. An elevator was used to elevate the labrum off the glenoid posteriorly in the affected regions described above. A rasp and shaver were used to debride posterior labral edges and roughen the glenoid for improvement of healing.  We began by placing the first knotless FiberTak at the 6:00 o'clock position using a curved guide.  Arthrex suture lasso was used to shuttle the stitch through the posteroinferior capsule and labrum adjacent to the anchor.  Appropriate passing mechanism was utilized and the suture was tensioned until the capsule and labrum were appropriately reduced with formation of an appropriate bumper.  The suture tail was cut flush with the articular cartilage. This process was repeated for anchors at the 7:00, 8:00, 9:00, and 10:00 positions. The repair was stable to probing, there was visible evidence of decreased joint space and capsular area. The shaver was used to debride any loose bony fragments from drilling as well as the previously visualized frayed cartilage on the glenoid.  Synovitic tissue about the posterior capsule and rotator interval was also debrided and then coagulated with an ArthroCare wand.    Additionally, the superior fibers of the subscapularis that were frayed were also debrided.  Arthroscopic fluid was then removed from the joint.   That concluded the case. The wounds were closed with 3-0 nylon. Xeroform gauze and sterile dressings were applied. Patient was placed in a neutral shoulder immobilizer and Polar Care.   Of note, assistance from a PA was essential to performing  the surgery. PA assisted with patient positioning, retraction, arm manipulation, and instrumentation. The surgery would have been more difficult and had longer operative time without PA assistance.      POSTOPERATIVE PLAN:  Patient will be discharged to home.  Physical therapy 3-4 days after surgery.  Return to the clinic 10-14 days postop for suture removal Maintain arm in immobilizer with neutral wedge. NWB. Posterior shoulder stabilization rehab protocol.

## 2020-07-28 NOTE — Anesthesia Preprocedure Evaluation (Signed)
Anesthesia Evaluation  Patient identified by MRN, date of birth, ID band Patient awake    Reviewed: Allergy & Precautions, H&P , NPO status , Patient's Chart, lab work & pertinent test results, reviewed documented beta blocker date and time   Airway Mallampati: II  TM Distance: >3 FB Neck ROM: full    Dental  (+) Teeth Intact   Pulmonary neg pulmonary ROS, Current Smoker and Patient abstained from smoking.,    Pulmonary exam normal        Cardiovascular Exercise Tolerance: Good negative cardio ROS Normal cardiovascular exam Rhythm:regular Rate:Normal     Neuro/Psych negative neurological ROS  negative psych ROS   GI/Hepatic Neg liver ROS, GERD  Medicated,  Endo/Other  Morbid obesity  Renal/GU negative Renal ROS  negative genitourinary   Musculoskeletal   Abdominal   Peds  Hematology negative hematology ROS (+)   Anesthesia Other Findings Past Medical History: No date: GERD (gastroesophageal reflux disease)     Comment:  occ Past Surgical History: No date: AMPUTATION; Right     Comment:  partial amputation toe 05/15/2018: BICEPT TENODESIS; Right     Comment:  Procedure: BICEPS TENODESIS;  Surgeon: Signa Kell, MD;              Location: [facility] ORS;  Service: Orthopedics;  Laterality:               Right; No date: FRACTURE SURGERY No date: HAND SURGERY 2016: SHOULDER ARTHROSCOPY; Right 05/15/2018: SHOULDER ARTHROSCOPY WITH LABRAL REPAIR; Right     Comment:  Procedure: SHOULDER ARTHROSCOPY WITH LABRAL REPAIR;                Surgeon: Signa Kell, MD;  Location: [facility] ORS;  Service:              Orthopedics;  Laterality: Right; No date: TONSILLECTOMY No date: umbilical cyst BMI    Body Mass Index: 40.17 kg/m     Reproductive/Obstetrics negative OB ROS                             Anesthesia Physical Anesthesia Plan  ASA: III  Anesthesia Plan: General ETT   Post-op Pain  Management:  Regional for Post-op pain   Induction:   PONV Risk Score and Plan: 2  Airway Management Planned:   Additional Equipment:   Intra-op Plan:   Post-operative Plan:   Informed Consent: I have reviewed the patients History and Physical, chart, labs and discussed the procedure including the risks, benefits and alternatives for the proposed anesthesia with the patient or authorized representative who has indicated his/her understanding and acceptance.     Dental Advisory Given  Plan Discussed with: CRNA  Anesthesia Plan Comments:         Anesthesia Quick Evaluation

## 2020-07-29 ENCOUNTER — Encounter: Payer: Self-pay | Admitting: Orthopedic Surgery

## 2020-07-29 NOTE — Anesthesia Postprocedure Evaluation (Signed)
Anesthesia Post Note  Patient: Waymon Laser  Procedure(s) Performed: Left posterior labral repair with possible biceps tenodesis - Dedra Skeens to Assist (Left Shoulder)  Patient location during evaluation: PACU Anesthesia Type: General Level of consciousness: awake and alert Pain management: pain level controlled Vital Signs Assessment: post-procedure vital signs reviewed and stable Respiratory status: spontaneous breathing, nonlabored ventilation, respiratory function stable and patient connected to nasal cannula oxygen Cardiovascular status: blood pressure returned to baseline and stable Postop Assessment: no apparent nausea or vomiting Anesthetic complications: no   No complications documented.   Last Vitals:  Vitals:   07/28/20 1516 07/28/20 1553  BP: 113/90 117/76  Pulse: 95 95  Resp: 16 16  Temp: 36.9 C   SpO2: 100% 98%    Last Pain:  Vitals:   07/29/20 0958  TempSrc:   PainSc: 3                  Yevette Edwards

## 2020-08-01 ENCOUNTER — Encounter: Payer: Self-pay | Admitting: Orthopedic Surgery

## 2020-09-24 ENCOUNTER — Other Ambulatory Visit: Payer: Self-pay

## 2020-09-24 ENCOUNTER — Emergency Department
Admission: EM | Admit: 2020-09-24 | Discharge: 2020-09-24 | Disposition: A | Discharge status: Home / Self Care | Payer: Commercial Managed Care - PPO | Attending: Emergency Medicine | Admitting: Emergency Medicine

## 2020-09-24 ENCOUNTER — Encounter: Payer: Self-pay | Admitting: Emergency Medicine

## 2020-09-24 ENCOUNTER — Emergency Department: Payer: Commercial Managed Care - PPO

## 2020-09-24 DIAGNOSIS — N200 Calculus of kidney: Secondary | ICD-10-CM | POA: Insufficient documentation

## 2020-09-24 DIAGNOSIS — R109 Unspecified abdominal pain: Secondary | ICD-10-CM | POA: Diagnosis present

## 2020-09-24 DIAGNOSIS — Z87891 Personal history of nicotine dependence: Secondary | ICD-10-CM | POA: Insufficient documentation

## 2020-09-24 DIAGNOSIS — Z7982 Long term (current) use of aspirin: Secondary | ICD-10-CM | POA: Diagnosis not present

## 2020-09-24 DIAGNOSIS — N50812 Left testicular pain: Secondary | ICD-10-CM | POA: Insufficient documentation

## 2020-09-24 LAB — COMPREHENSIVE METABOLIC PANEL
ALT: 39 U/L (ref 0–44)
AST: 28 U/L (ref 15–41)
Albumin: 4.4 g/dL (ref 3.5–5.0)
Alkaline Phosphatase: 100 U/L (ref 38–126)
Anion gap: 13 (ref 5–15)
BUN: 18 mg/dL (ref 6–20)
CO2: 23 mmol/L (ref 22–32)
Calcium: 9.3 mg/dL (ref 8.9–10.3)
Chloride: 104 mmol/L (ref 98–111)
Creatinine, Ser: 1.12 mg/dL (ref 0.61–1.24)
GFR, Estimated: >60 mL/min (ref >60)
Glucose, Bld: 135 mg/dL — ABNORMAL HIGH (ref 70–99)
Potassium: 3.7 mmol/L (ref 3.5–5.1)
Sodium: 140 mmol/L (ref 135–145)
Total Bilirubin: 1 mg/dL (ref 0.3–1.2)
Total Protein: 7.9 g/dL (ref 6.5–8.1)

## 2020-09-24 LAB — CBC WITH DIFFERENTIAL/PLATELET
Abs Immature Granulocytes: 0.07 10*3/uL (ref 0.00–0.07)
Basophils Absolute: 0.1 10*3/uL (ref 0.0–0.1)
Basophils Relative: 1 %
Eosinophils Absolute: 0.1 10*3/uL (ref 0.0–0.5)
Eosinophils Relative: 1 %
HCT: 48.4 % (ref 39.0–52.0)
Hemoglobin: 16.8 g/dL (ref 13.0–17.0)
Immature Granulocytes: 1 %
Lymphocytes Relative: 24 %
Lymphs Abs: 2.4 10*3/uL (ref 0.7–4.0)
MCH: 33.5 pg (ref 26.0–34.0)
MCHC: 34.7 g/dL (ref 30.0–36.0)
MCV: 96.6 fL (ref 80.0–100.0)
Monocytes Absolute: 0.6 10*3/uL (ref 0.1–1.0)
Monocytes Relative: 6 %
Neutro Abs: 6.9 10*3/uL (ref 1.7–7.7)
Neutrophils Relative %: 67 %
Platelets: 334 10*3/uL (ref 150–400)
RBC: 5.01 MIL/uL (ref 4.22–5.81)
RDW: 11.9 % (ref 11.5–15.5)
WBC: 10.2 10*3/uL (ref 4.0–10.5)
nRBC: 0 % (ref 0.0–0.2)

## 2020-09-24 MED ORDER — ONDANSETRON 4 MG PO TBDP: 4.0000 mg | ORAL_TABLET | Freq: Three times a day (TID) | ORAL | 0 refills | Status: AC | PRN

## 2020-09-24 MED ORDER — TAMSULOSIN HCL 0.4 MG PO CAPS
0.4000 mg | ORAL_CAPSULE | Freq: Once | ORAL | Status: AC
  Administered 2020-09-24: 0.4 mg via ORAL
  Filled 2020-09-24: qty 1

## 2020-09-24 MED ORDER — HYDROCODONE-ACETAMINOPHEN 5-325 MG PO TABS
2.0000 | ORAL_TABLET | Freq: Once | ORAL | Status: AC
  Administered 2020-09-24: 2 via ORAL
  Filled 2020-09-24: qty 2

## 2020-09-24 MED ORDER — TAMSULOSIN HCL 0.4 MG PO CAPS: 0.4000 mg | ORAL_CAPSULE | Freq: Every day | ORAL | 0 refills | Status: AC

## 2020-09-24 MED ORDER — KETOROLAC TROMETHAMINE 30 MG/ML IJ SOLN
30.0000 mg | Freq: Once | INTRAMUSCULAR | Status: AC
  Administered 2020-09-24: 30 mg via INTRAVENOUS
  Filled 2020-09-24: qty 1

## 2020-09-24 MED ORDER — ONDANSETRON HCL 4 MG/2ML IJ SOLN
4.0000 mg | Freq: Once | INTRAMUSCULAR | Status: AC
  Administered 2020-09-24: 4 mg via INTRAVENOUS
  Filled 2020-09-24: qty 2

## 2020-09-24 MED ORDER — MORPHINE SULFATE (PF) 4 MG/ML IV SOLN
4.0000 mg | Freq: Once | INTRAVENOUS | Status: AC
  Administered 2020-09-24: 4 mg via INTRAVENOUS
  Filled 2020-09-24: qty 1

## 2020-09-24 MED ORDER — HYDROCODONE-ACETAMINOPHEN 5-325 MG PO TABS
2.0000 | ORAL_TABLET | Freq: Four times a day (QID) | ORAL | 0 refills | Status: AC | PRN
Start: 2020-09-24 — End: ?

## 2020-09-24 MED ORDER — SODIUM CHLORIDE 0.9 % IV BOLUS
1000.0000 mL | Freq: Once | INTRAVENOUS | Status: AC
  Administered 2020-09-24: 1000 mL via INTRAVENOUS

## 2020-09-24 NOTE — ED Triage Notes (Signed)
Pt reports awoke at midnight with pain to left flank/testicle and lower abd accomp by nausea; st hx kidney stones

## 2020-09-24 NOTE — ED Notes (Signed)
Pt resting at this time. Pt unhooked from monitor to use bedside toilet, pt stand by assist. Pt states frank red blood in urine but flushed before this RN could see it, MD aware. Pt states increase in pain, see MAR for pain medication orders. NAD noted. VSS.

## 2020-09-24 NOTE — ED Provider Notes (Signed)
[facility] Emergency Department Provider Note   ____________________________________________   First MD Initiated Contact with Patient 09/24/20 0440     (approximate)  I have reviewed the triage vital signs and the nursing notes.   HISTORY  Chief Complaint Flank Pain    HPI Cory Rocha is a 34 y.o. male with a stated past medical history of kidney stones who presents for left flank pain that radiates to his groin that woke him up from sleep just prior to arrival.  Patient describes 10/10 sharp/burning left flank pain that radiates to the left testicle and groin.  Patient denies any exacerbating factors but states that staying standing and walking around partially relieves this pain.  Patient states that this pain is similar to kidney stones that he has had in the past except it is worse.  Patient also endorses associated nausea         Past Medical History:  Diagnosis Date  . GERD (gastroesophageal reflux disease)    occ    There are no problems to display for this patient.   Past Surgical History:  Procedure Laterality Date  . AMPUTATION Right    partial amputation toe  . BICEPT TENODESIS Right 05/15/2018   Procedure: BICEPS TENODESIS;  Surgeon: Signa Kell, MD;  Location: [facility] ORS;  Service: Orthopedics;  Laterality: Right;  . FRACTURE SURGERY    . HAND SURGERY    . SHOULDER ARTHROSCOPY Right 2016  . SHOULDER ARTHROSCOPY WITH LABRAL REPAIR Right 05/15/2018   Procedure: SHOULDER ARTHROSCOPY WITH LABRAL REPAIR;  Surgeon: Signa Kell, MD;  Location: [facility] ORS;  Service: Orthopedics;  Laterality: Right;  . SHOULDER ARTHROSCOPY WITH LABRAL REPAIR Left 07/28/2020   Procedure: Left posterior labral repair with possible biceps tenodesis - Dedra Skeens to Assist;  Surgeon: Signa Kell, MD;  Location: [facility] ORS;  Service: Orthopedics;  Laterality: Left;  . TONSILLECTOMY    . umbilical cyst      Prior to Admission medications   Medication Sig Start  Date End Date Taking? Authorizing Provider  acetaminophen (TYLENOL) 500 MG tablet Take 1,000 mg by mouth every 6 (six) hours as needed for moderate pain or headache.    [provider]  acetaminophen (TYLENOL) 500 MG tablet Take 2 tablets (1,000 mg total) by mouth every 8 (eight) hours. 07/28/20 07/28/21  Signa Kell, MD  aspirin-acetaminophen-caffeine [facility] MIGRAINE) [phone] MG tablet Take 2 tablets by mouth every 6 (six) hours as needed for headache.     [provider]  HYDROcodone-acetaminophen (NORCO) 5-325 MG tablet Take 2 tablets by mouth every 6 (six) hours as needed for moderate pain. 09/24/20   Merwyn Katos, MD  Melatonin 10 MG CAPS Take 10 mg by mouth at bedtime as needed (sleep).    [provider]  ondansetron (ZOFRAN ODT) 4 MG disintegrating tablet Take 1 tablet (4 mg total) by mouth every 8 (eight) hours as needed for nausea or vomiting. 09/24/20   Merwyn Katos, MD  oxyCODONE (ROXICODONE) 5 MG immediate release tablet Take 1-2 tablets (5-10 mg total) by mouth every 4 (four) hours as needed (pain). 07/28/20 07/28/21  Signa Kell, MD  tamsulosin (FLOMAX) 0.4 MG CAPS capsule Take 1 capsule (0.4 mg total) by mouth daily for 7 days. 09/24/20 10/01/20  Merwyn Katos, MD    Allergies Atomoxetine and Tramadol  No family history on file.  Social History Social History   Tobacco Use  . Smoking status: Former Smoker    Types: E-cigarettes  .  Smokeless tobacco: Former Neurosurgeon    Quit date: 11/11/2016  Vaping Use  . Vaping Use: Every day  . Substances: Nicotine, Flavoring  Substance Use Topics  . Alcohol use: Not Currently  . Drug use: Never    Review of Systems Constitutional: No fever/chills Eyes: No visual changes. ENT: No sore throat. Cardiovascular: Denies chest pain. Respiratory: Denies shortness of breath. Gastrointestinal: Endorses abdominal pain.  Endorses nausea, no vomiting.  No diarrhea. Genitourinary: Negative for  dysuria. Musculoskeletal: Negative for acute arthralgias Skin: Negative for rash. Neurological: Negative for headaches, weakness/numbness/paresthesias in any extremity Psychiatric: Negative for suicidal ideation/homicidal ideation   ____________________________________________   PHYSICAL EXAM:  VITAL SIGNS: ED Triage Vitals  Enc Vitals Group     BP 09/24/20 0435 (!) 129/92     Pulse Rate 09/24/20 0435 86     Resp 09/24/20 0435 20     Temp 09/24/20 0435 97.8 F (36.6 C)     Temp Source 09/24/20 0435 Oral     SpO2 09/24/20 0435 98 %     Weight 09/24/20 0433 280 lb (127 kg)     Height 09/24/20 0433 5\' 10"  (1.778 m)     Head Circumference --      Peak Flow --      Pain Score 09/24/20 0433 8     Pain Loc --      Pain Edu? --      Excl. in GC? --    Constitutional: Alert and oriented. Well appearing and in no acute distress. Eyes: Conjunctivae are normal. PERRL. Head: Atraumatic. Nose: No congestion/rhinnorhea. Mouth/Throat: Mucous membranes are moist. Neck: No stridor Cardiovascular: Grossly normal heart sounds.  Good peripheral circulation. Respiratory: Normal respiratory effort.  No retractions. Gastrointestinal: Soft and nontender. No distention.  Left CVA tenderness to percussion Musculoskeletal: No obvious deformities Neurologic:  Normal speech and language. No gross focal neurologic deficits are appreciated. Skin:  Skin is warm and dry. No rash noted. Psychiatric: Mood and affect are normal. Speech and behavior are normal.  ____________________________________________   LABS (all labs ordered are listed, but only abnormal results are displayed)  Labs Reviewed  CBC WITH DIFFERENTIAL/PLATELET  COMPREHENSIVE METABOLIC PANEL  URINALYSIS, COMPLETE (UACMP) WITH MICROSCOPIC   RADIOLOGY  ED MD interpretation: CT of the abdomen and pelvis without contrast shows a 2 mm stone in the proximal left ureter with moderate proximal obstruction.  Official radiology  report(s): CT Renal Stone Study  Result Date: 09/24/2020 CLINICAL DATA:  Left flank pain and lower abdominal pain with nausea. History of kidney stones. EXAM: CT ABDOMEN AND PELVIS WITHOUT CONTRAST TECHNIQUE: Multidetector CT imaging of the abdomen and pelvis was performed following the standard protocol without IV contrast. COMPARISON:  None. FINDINGS: Lower chest: Lung bases are clear. Hepatobiliary: No focal liver abnormality is seen. No gallstones, gallbladder wall thickening, or biliary dilatation. Pancreas: Unremarkable. No pancreatic ductal dilatation or surrounding inflammatory changes. Spleen: Normal in size without focal abnormality. Adrenals/Urinary Tract: No adrenal gland nodules. 2 mm stone in the proximal left ureter at the level of L2-3 with mild proximal hydronephrosis and stranding around the left kidney and ureter. Distal ureter is decompressed. Right kidney and ureter are unremarkable. Bladder is normal. Stomach/Bowel: Stomach is within normal limits. Appendix appears normal. No evidence of bowel wall thickening, distention, or inflammatory changes. Vascular/Lymphatic: No significant vascular findings are present. No enlarged abdominal or pelvic lymph nodes. Reproductive: Prostate is unremarkable. Other: No abdominal wall hernia or abnormality. No abdominopelvic ascites. Musculoskeletal: No acute or significant  osseous findings. IMPRESSION: 2 mm stone in the proximal left ureter with moderate proximal obstruction. Electronically Signed   By: Burman Nieves M.D.   On: 09/24/2020 06:01    ____________________________________________   PROCEDURES  Procedure(s) performed (including Critical Care):  .1-3 Lead EKG Interpretation Performed by: Merwyn Katos, MD Authorized by: Merwyn Katos, MD     Interpretation: normal     ECG rate:  74   ECG rate assessment: normal     Rhythm: sinus rhythm     Ectopy: none     Conduction: normal        ____________________________________________   INITIAL IMPRESSION / ASSESSMENT AND PLAN / ED COURSE  As part of my medical decision making, I reviewed the following data within the electronic MEDICAL RECORD NUMBER Nursing notes reviewed and incorporated, Labs reviewed, EKG interpreted, Old chart reviewed, Radiograph reviewed and Notes from prior ED visits reviewed and incorporated        Patient presents for severe flank pain. Presentation most consistent with Renal Colic from a Non-infected Kidney Stone. Given History and Exam I have lower suspicion for atypical appendicitis, genital torsion, acute cholecystitis, AAA, Aortic Dissection, Serious Bacterial Illness or other emergent intraabdominal pathology.  Workup: CBC, BMP, CT Abd/Pelvis noncontrast, UA, reassess Findings: CT of the abdomen and pelvis showing a 2 mm proximal left ureteral stone Reassesment: Patient tolerating PO and pain controlled Disposition:  Discharge. Strict return precautions for infected stone or PO intolerance discussed.      ____________________________________________   FINAL CLINICAL IMPRESSION(S) / ED DIAGNOSES  Final diagnoses:  Left flank pain  Kidney stone on left side     ED Discharge Orders         Ordered    HYDROcodone-acetaminophen (NORCO) 5-325 MG tablet  Every 6 hours PRN        09/24/20 0630    ondansetron (ZOFRAN ODT) 4 MG disintegrating tablet  Every 8 hours PRN        09/24/20 0630    tamsulosin (FLOMAX) 0.4 MG CAPS capsule  Daily        09/24/20 0630           Note:  This document was prepared using Dragon voice recognition software and may include unintentional dictation errors.   Merwyn Katos, MD 09/24/20 [id number]
